# Patient Record
Sex: Female | Born: 1975
Health system: Southern US, Community
[De-identification: ages and names within clinical notes are randomized; demographics above are authoritative.]

## PROBLEM LIST (undated history)

## (undated) DIAGNOSIS — B009 Herpesviral infection, unspecified: Secondary | ICD-10-CM

## (undated) DIAGNOSIS — K219 Gastro-esophageal reflux disease without esophagitis: Secondary | ICD-10-CM

## (undated) DIAGNOSIS — R112 Nausea with vomiting, unspecified: Secondary | ICD-10-CM

## (undated) DIAGNOSIS — R87629 Unspecified abnormal cytological findings in specimens from vagina: Secondary | ICD-10-CM

## (undated) DIAGNOSIS — Z9889 Other specified postprocedural states: Secondary | ICD-10-CM

## (undated) DIAGNOSIS — N879 Dysplasia of cervix uteri, unspecified: Secondary | ICD-10-CM

## (undated) HISTORY — DX: Dysplasia of cervix uteri, unspecified: N87.9

## (undated) HISTORY — PX: COLPOSCOPY: SHX161

## (undated) HISTORY — PX: TONSILLECTOMY AND ADENOIDECTOMY: SUR1326

## (undated) HISTORY — DX: Unspecified abnormal cytological findings in specimens from vagina: R87.629

## (undated) HISTORY — PX: ESOPHAGEAL DILATION: SHX303

## (undated) SURGERY — Surgical Case
Anesthesia: *Unknown

---

## 2000-10-23 ENCOUNTER — Other Ambulatory Visit: Admission: RE | Admit: 2000-10-23 | Discharge: 2000-10-23 | Payer: Self-pay | Admitting: Obstetrics and Gynecology

## 2001-04-04 ENCOUNTER — Other Ambulatory Visit: Admission: RE | Admit: 2001-04-04 | Discharge: 2001-04-04 | Payer: Self-pay | Admitting: *Deleted

## 2002-09-10 ENCOUNTER — Other Ambulatory Visit: Admission: RE | Admit: 2002-09-10 | Discharge: 2002-09-10 | Payer: Self-pay | Admitting: Gynecology

## 2003-10-25 ENCOUNTER — Other Ambulatory Visit: Admission: RE | Admit: 2003-10-25 | Discharge: 2003-10-25 | Payer: Self-pay | Admitting: Gynecology

## 2004-04-06 ENCOUNTER — Other Ambulatory Visit: Admission: RE | Admit: 2004-04-06 | Discharge: 2004-04-06 | Payer: Self-pay | Admitting: Gynecology

## 2005-04-23 ENCOUNTER — Ambulatory Visit: Payer: Self-pay | Admitting: Licensed Clinical Social Worker

## 2005-04-24 ENCOUNTER — Other Ambulatory Visit: Admission: RE | Admit: 2005-04-24 | Discharge: 2005-04-24 | Payer: Self-pay | Admitting: Gynecology

## 2005-04-25 ENCOUNTER — Ambulatory Visit: Payer: Self-pay | Admitting: Licensed Clinical Social Worker

## 2005-05-01 ENCOUNTER — Ambulatory Visit: Payer: Self-pay | Admitting: Licensed Clinical Social Worker

## 2005-05-16 ENCOUNTER — Ambulatory Visit: Payer: Self-pay | Admitting: Licensed Clinical Social Worker

## 2005-05-23 ENCOUNTER — Ambulatory Visit: Payer: Self-pay | Admitting: Licensed Clinical Social Worker

## 2005-06-06 ENCOUNTER — Ambulatory Visit: Payer: Self-pay | Admitting: Licensed Clinical Social Worker

## 2005-06-27 ENCOUNTER — Ambulatory Visit: Payer: Self-pay | Admitting: Licensed Clinical Social Worker

## 2005-10-29 ENCOUNTER — Other Ambulatory Visit: Admission: RE | Admit: 2005-10-29 | Discharge: 2005-10-29 | Payer: Self-pay | Admitting: Gynecology

## 2006-04-29 ENCOUNTER — Other Ambulatory Visit: Admission: RE | Admit: 2006-04-29 | Discharge: 2006-04-29 | Payer: Self-pay | Admitting: Gynecology

## 2007-06-30 ENCOUNTER — Other Ambulatory Visit: Admission: RE | Admit: 2007-06-30 | Discharge: 2007-06-30 | Payer: Self-pay | Admitting: Gynecology

## 2008-06-07 ENCOUNTER — Encounter: Admission: RE | Admit: 2008-06-07 | Discharge: 2008-06-07 | Payer: Self-pay | Admitting: Gastroenterology

## 2008-10-08 ENCOUNTER — Other Ambulatory Visit: Admission: RE | Admit: 2008-10-08 | Discharge: 2008-10-08 | Payer: Self-pay | Admitting: Gynecology

## 2008-10-08 ENCOUNTER — Encounter: Payer: Self-pay | Admitting: Gynecology

## 2008-10-08 ENCOUNTER — Ambulatory Visit: Payer: Self-pay | Admitting: Gynecology

## 2009-10-17 ENCOUNTER — Ambulatory Visit: Payer: Self-pay | Admitting: Gynecology

## 2009-10-17 ENCOUNTER — Other Ambulatory Visit: Admission: RE | Admit: 2009-10-17 | Discharge: 2009-10-17 | Payer: Self-pay | Admitting: Gynecology

## 2009-11-24 ENCOUNTER — Ambulatory Visit: Payer: Self-pay | Admitting: Gynecology

## 2010-01-26 ENCOUNTER — Ambulatory Visit
Admission: RE | Admit: 2010-01-26 | Discharge: 2010-01-26 | Payer: Self-pay | Source: Home / Self Care | Attending: Women's Health | Admitting: Women's Health

## 2010-04-13 IMAGING — RF DG ESOPHAGUS
12 of 14 series · 19 of 24 positions shown · non-contrast
Comparison: None

CLINICAL DATA: Dysphagia.

ESOPHOGRAM / BARIUM SWALLOW  / BARIUM TABLET STUDY
TECHNIQUE: Combined double contrast and single contrast
examination performed using effervescent crystals, thick barium
liquid, and thin barium liquid.  The patient was observed with
fluoroscopy swallowing a 13mm barium sulphate tablet.
Fluoroscopy time:  1.6 minutes.

[Series 1: run · 1 of 2 slices shown (1 of 12)]
[im 1/2]
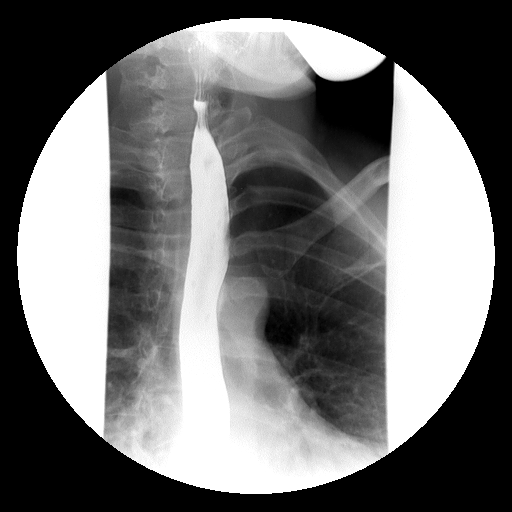

[Series 2: run · 1 of 2 slices shown (2 of 12)]
[im 1/2]
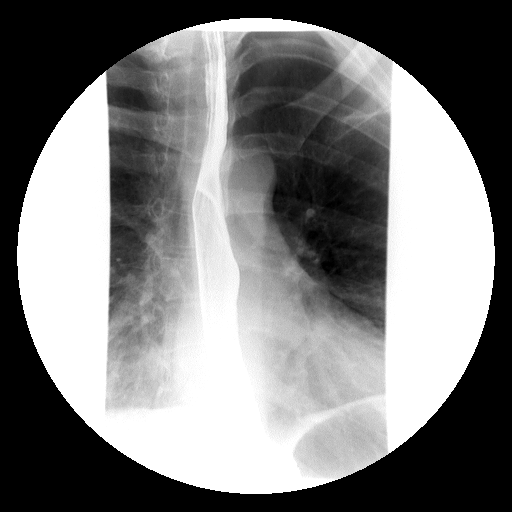

[Series 4: run · 1 of 2 slices shown (3 of 12)]
[im 1/2]
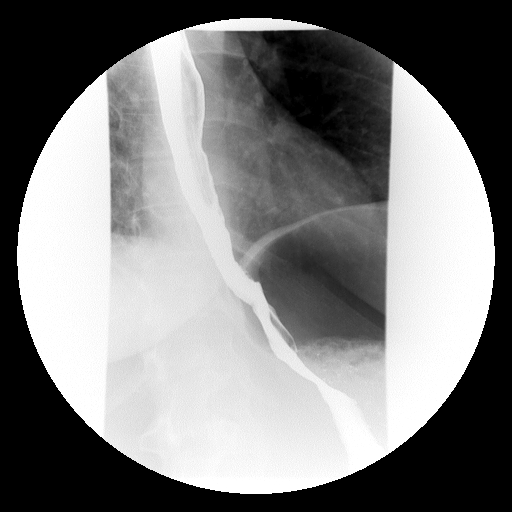

[Series 5: run · 1 of 1 slices shown (4 of 12)]
[im 1/1]
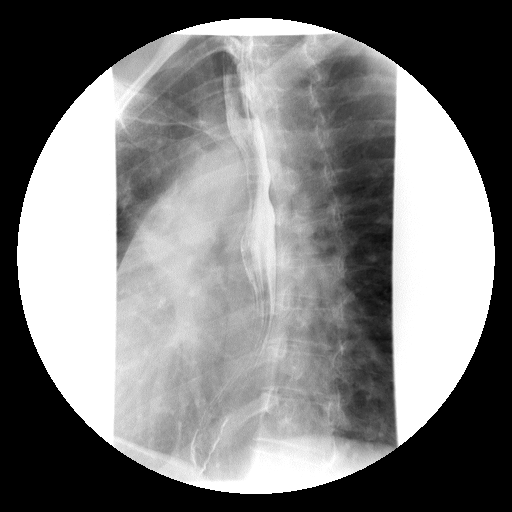

[Series 6: run · 1 of 1 slices shown (5 of 12)]
[im 1/1]
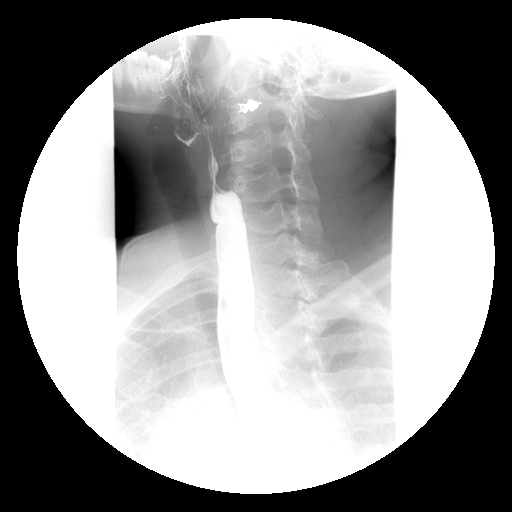

[Series 7: run · 1 of 1 slices shown (6 of 12)]
[im 1/1]
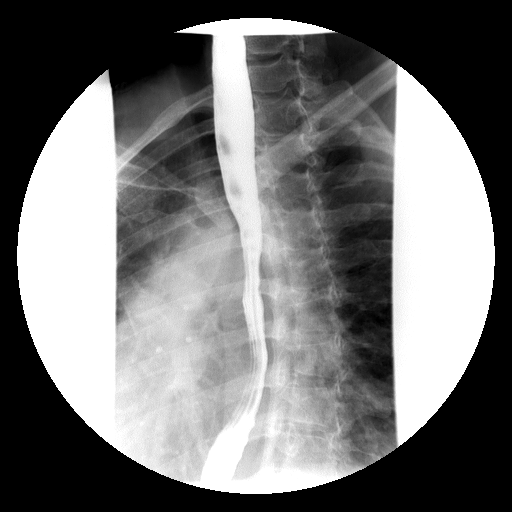

[Series 9: run · 1 of 1 slices shown (7 of 12)]
[im 1/1]
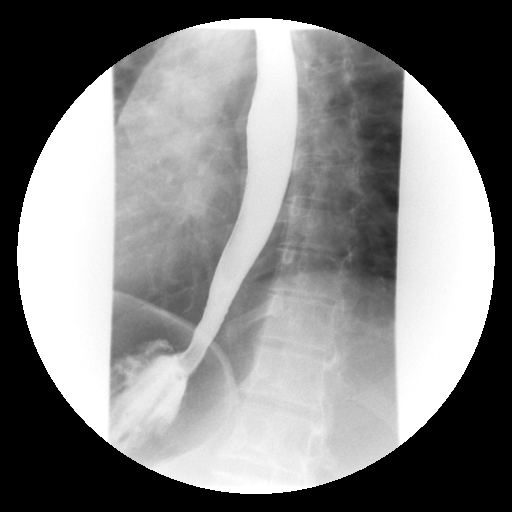

[Series 10: run · 1 of 1 slices shown (8 of 12)]
[im 1/1]
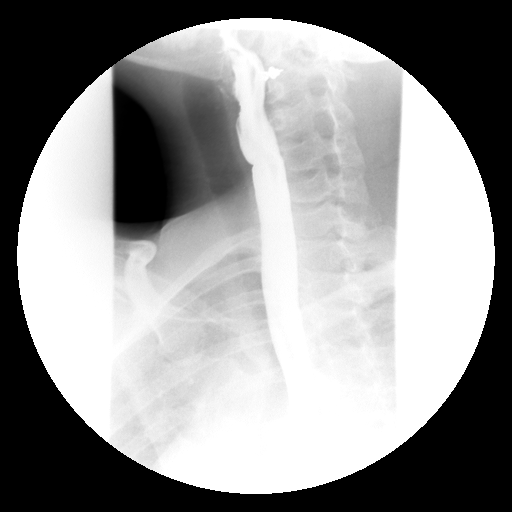

[Series 11: run · 1 of 1 slices shown (9 of 12)]
[im 1/1]
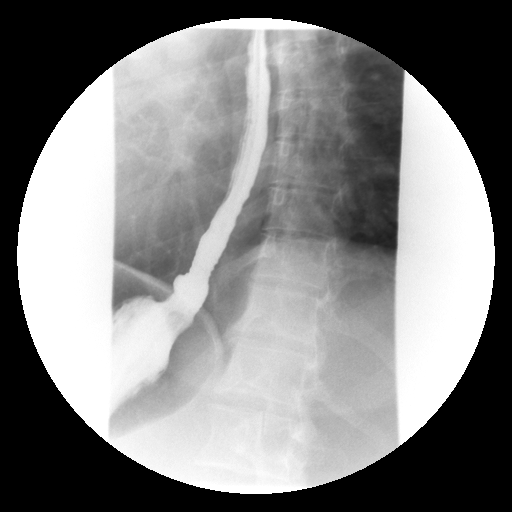

[Series 12: run · 5 of 9 slices shown (10 of 12)]
[im 2/9]
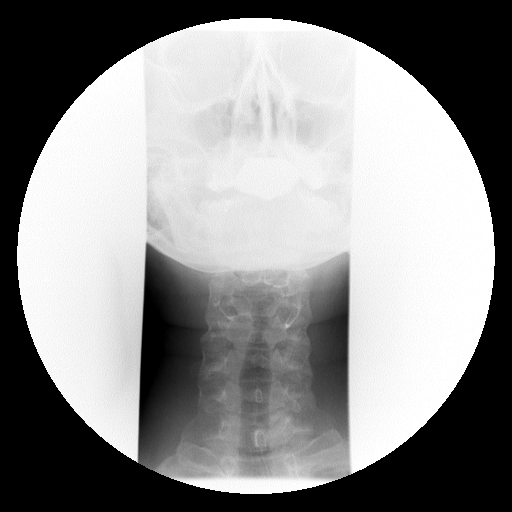
[im 3/9]
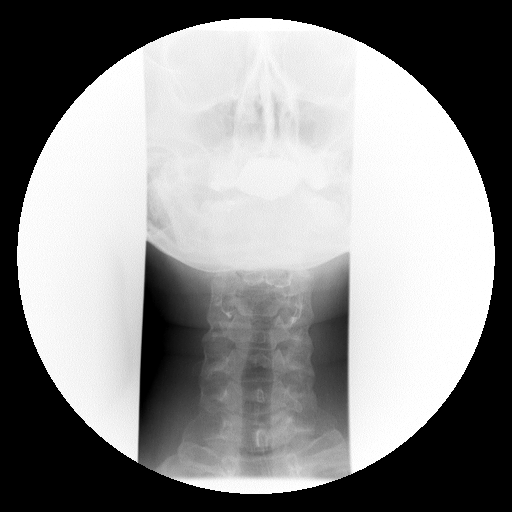
[im 5/9]
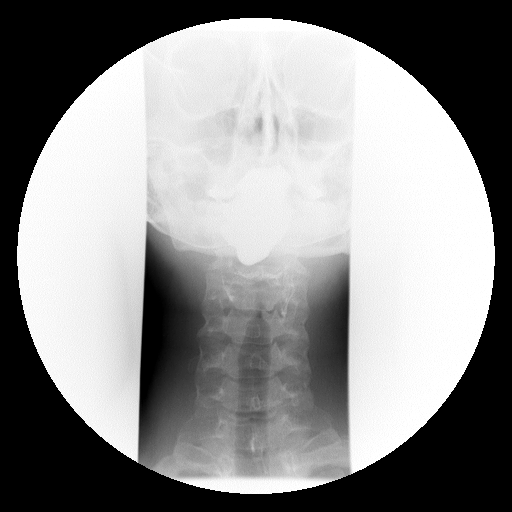
[im 6/9]
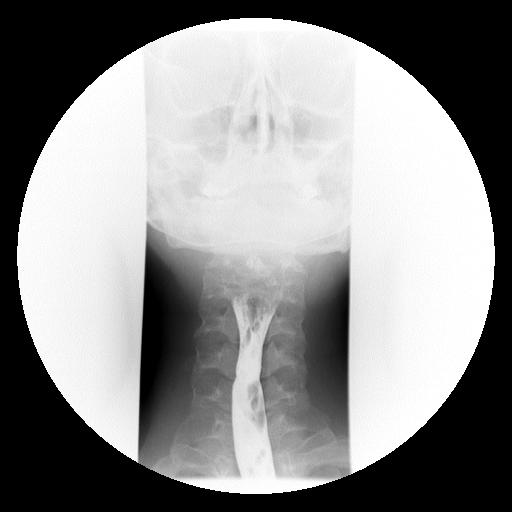
[im 9/9]
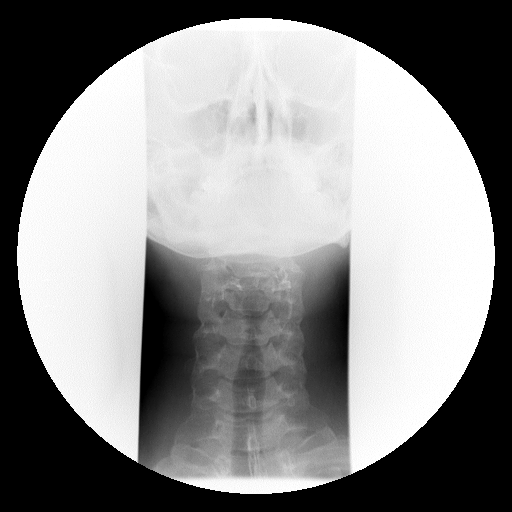

[Series 13: run · 4 of 7 slices shown (11 of 12)]
[im 1/7]
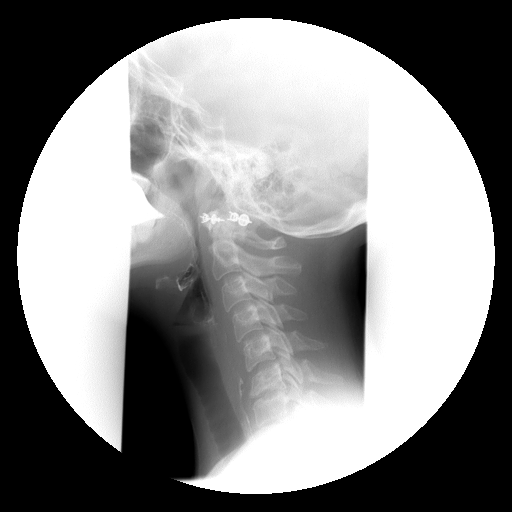
[im 2/7]
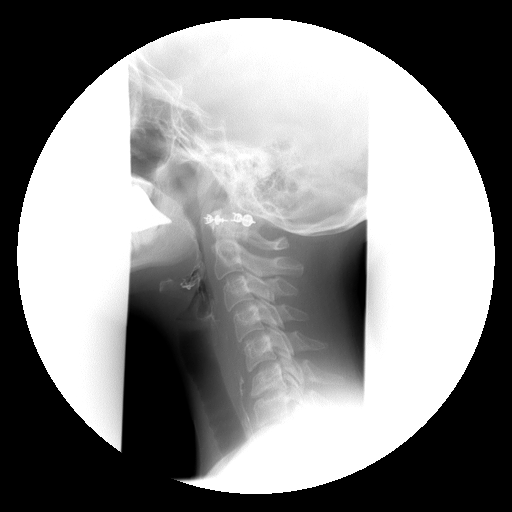
[im 4/7]
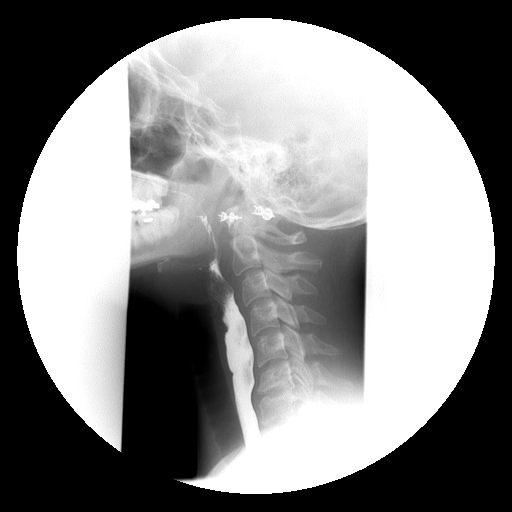
[im 7/7]
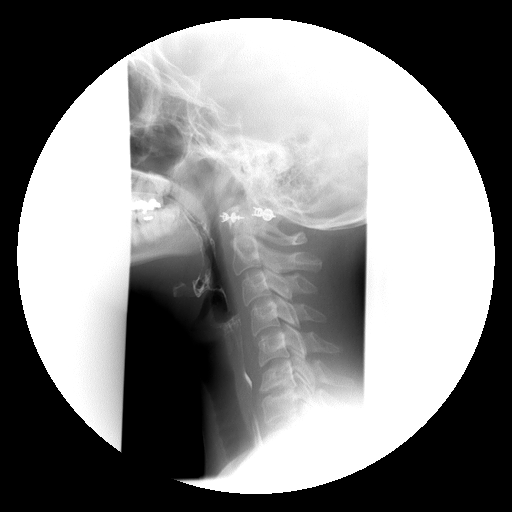

[Series 14: run · 1 of 1 slices shown (12 of 12)]
[im 1/1]
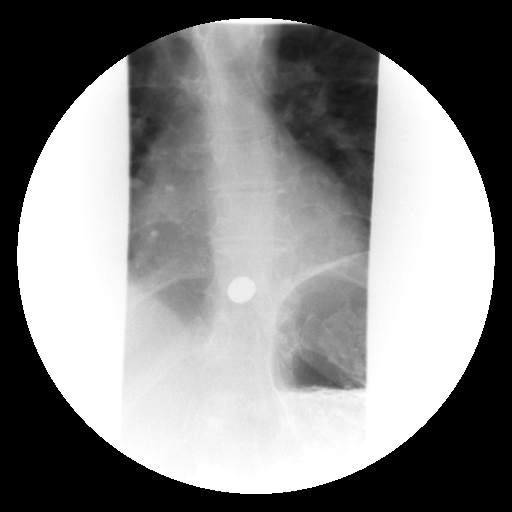

[19 of 24 positions shown; findings below may reference images not displayed]

FINDINGS: Esophageal peristalsis is normal.  Note is made of an
asymptomatic esophageal web at the pharyngoesophageal junction.
Esophageal mucosal pattern is normal.  There is smooth narrowing of
the distal esophagus near the gastroesophageal junction.  No hiatal
hernia is identified.  Evaluation of the cervical esophagus is
unremarkable.  Barium tablet is swallowed without difficulty but is
upheld at the gastroesophageal junction.
IMPRESSION: 1.  Normal esophageal peristalsis.
2.  Asymptomatic esophageal web.
3.  Smooth distal esophageal stricture, upholding the 13 mm barium
tablet.

## 2010-08-07 ENCOUNTER — Encounter: Payer: Self-pay | Admitting: *Deleted

## 2010-10-20 ENCOUNTER — Encounter: Payer: Self-pay | Admitting: Women's Health

## 2010-10-20 ENCOUNTER — Other Ambulatory Visit (HOSPITAL_COMMUNITY)
Admission: RE | Admit: 2010-10-20 | Discharge: 2010-10-20 | Disposition: A | Discharge status: Home / Self Care | Payer: BC Managed Care – PPO | Source: Ambulatory Visit | Attending: Gynecology | Admitting: Gynecology

## 2010-10-20 ENCOUNTER — Ambulatory Visit (INDEPENDENT_AMBULATORY_CARE_PROVIDER_SITE_OTHER): Payer: BC Managed Care – PPO | Admitting: Women's Health

## 2010-10-20 VITALS — BP 114/70 | Ht 63.0 in | Wt 136.0 lb

## 2010-10-20 DIAGNOSIS — IMO0001 Reserved for inherently not codable concepts without codable children: Secondary | ICD-10-CM

## 2010-10-20 DIAGNOSIS — Z13220 Encounter for screening for lipoid disorders: Secondary | ICD-10-CM

## 2010-10-20 DIAGNOSIS — Z113 Encounter for screening for infections with a predominantly sexual mode of transmission: Secondary | ICD-10-CM

## 2010-10-20 DIAGNOSIS — Z01419 Encounter for gynecological examination (general) (routine) without abnormal findings: Secondary | ICD-10-CM | POA: Insufficient documentation

## 2010-10-20 DIAGNOSIS — Z833 Family history of diabetes mellitus: Secondary | ICD-10-CM

## 2010-10-20 DIAGNOSIS — Z309 Encounter for contraceptive management, unspecified: Secondary | ICD-10-CM

## 2010-10-20 LAB — RPR

## 2010-10-20 MED ORDER — LEVONORGEST-ETH ESTRAD 91-DAY 0.15-0.03 MG PO TABS: 1.0000 | ORAL_TABLET | Freq: Every day | ORAL | Status: DC

## 2010-10-20 NOTE — Progress Notes (Signed)
Faith Jacobson 19-Mar-1975 [id number]    History:    The patient presents for annual exam.  Works at [employer].   Past medical history, past surgical history, family history and social history were all reviewed and documented in the EPIC chart.   ROS:  A  ROS was performed and pertinent positives and negatives are included in the history.  Exam:  Filed Vitals:   10/20/10 1550  BP: 114/70    General appearance:  Normal Head/Neck:  Normal, without cervical or supraclavicular adenopathy. Thyroid:  Symmetrical, normal in size, without palpable masses or nodularity. Respiratory  Effort:  Normal  Auscultation:  Clear without wheezing or rhonchi Cardiovascular  Auscultation:  Regular rate, without rubs, murmurs or gallops  Edema/varicosities:  Not grossly evident Abdominal  Soft,nontender, without masses, guarding or rebound.  Liver/spleen:  No organomegaly noted  Hernia:  None appreciated  Skin  Inspection:  Grossly normal  Palpation:  Grossly normal Neurologic/psychiatric  Orientation:  Normal with appropriate conversation.  Mood/affect:  Normal  Genitourinary    Breasts: Examined lying and sitting.     Right: Without masses, retractions, discharge or axillary adenopathy.     Left: Without masses, retractions, discharge or axillary adenopathy.   Inguinal/mons:  Normal without inguinal adenopathy  External genitalia:  Normal  BUS/Urethra/Skene's glands:  Normal  Bladder:  Normal  Vagina:  Normal  Cervix:  Normal  Uterus:   normal in size, shape and contour.  Midline and mobile  Adnexa/parametria:     Rt: Without masses or tenderness.   Lt: Without masses or tenderness.  Anus and perineum: Normal  Digital rectal exam: Normal sphincter tone without palpated masses or tenderness  Assessment/Plan:  35 y.o. SWF G0 for annual exam. Contraceptives with Seasonale with a cycle every 3 months for 2-3 days. She is in the process of quitting smoking, aware when she  is 35  increased risk for blood clots strokes with smoking and birth control pills. She states she is ready to quit declines need for medication, she states she will get a reduced rate with her insurance which is also motivating her to quit smoking.    Normal GYN exam/STD screen/history LGSIL October, 2011  Plan: Seasonale  prescription, proper use, risks of blood clots, DVTs. Did review importance of no birth control pills after age 35 if smoking due to risks. Verbalizes understanding. SBEs, exercise, calcium rich diet, MVI daily encouraged. CBC, glucose, cholesterol, UA and Pap GC/ Chlamydia, HIV, hep B and C., and and RPR. Encourage condoms when sexually active. History of a C&B October of 2011 LG SIL, repeat Pap in 6 months if normal.   Harrington Challenger [facility], 4:42 PM 10/20/2010

## 2010-10-20 NOTE — Progress Notes (Signed)
Addended by: Landis Martins R on: 10/20/2010 05:08 PM   Modules accepted: Orders

## 2010-10-21 LAB — HEPATITIS C ANTIBODY: HCV Ab: NEGATIVE

## 2010-10-21 LAB — HEPATITIS B SURFACE ANTIGEN: Hepatitis B Surface Ag: NEGATIVE

## 2010-11-02 ENCOUNTER — Other Ambulatory Visit: Payer: Self-pay | Admitting: Gynecology

## 2011-04-19 ENCOUNTER — Inpatient Hospital Stay (HOSPITAL_COMMUNITY): Admission: RE | Admit: 2011-04-19 | Payer: Self-pay | Source: Ambulatory Visit

## 2011-04-19 ENCOUNTER — Ambulatory Visit (INDEPENDENT_AMBULATORY_CARE_PROVIDER_SITE_OTHER): Payer: BC Managed Care – PPO | Admitting: Women's Health

## 2011-04-19 ENCOUNTER — Encounter: Payer: Self-pay | Admitting: Women's Health

## 2011-04-19 DIAGNOSIS — B373 Candidiasis of vulva and vagina: Secondary | ICD-10-CM

## 2011-04-19 DIAGNOSIS — B3731 Acute candidiasis of vulva and vagina: Secondary | ICD-10-CM

## 2011-04-19 DIAGNOSIS — IMO0002 Reserved for concepts with insufficient information to code with codable children: Secondary | ICD-10-CM

## 2011-04-19 DIAGNOSIS — R87619 Unspecified abnormal cytological findings in specimens from cervix uteri: Secondary | ICD-10-CM

## 2011-04-19 LAB — WET PREP FOR TRICH, YEAST, CLUE: Trich, Wet Prep: NONE SEEN

## 2011-04-19 MED ORDER — FLUCONAZOLE 150 MG PO TABS: 150.0000 mg | ORAL_TABLET | Freq: Once | ORAL | Status: AC

## 2011-04-19 NOTE — Progress Notes (Signed)
Patient ID: Faith Jacobson, female   DOB: Apr 06, 1975, 36 y.o.   MRN: [id number] Presents for repeat Pap. History of LGSIL/CIN 1 October of 2011 on colposcopy with biopsy. Normal Paps after. Also states is having some vaginal discharge with itching. Same partner.  Exam: External genitalia within normal limits, speculum exam scant white discharge with no odor, vaginal wall slightly erythematous. Wet prep negative, Pap taken. Bimanual no CMT or adnexal fullness or tenderness.  Plan: Diflucan 150 by mouth for symptoms of yeast.  Pap results if normal will resume annual screening.

## 2011-10-11 ENCOUNTER — Other Ambulatory Visit: Payer: Self-pay | Admitting: Gynecology

## 2011-10-22 ENCOUNTER — Ambulatory Visit (INDEPENDENT_AMBULATORY_CARE_PROVIDER_SITE_OTHER): Payer: BC Managed Care – PPO | Admitting: Women's Health

## 2011-10-22 ENCOUNTER — Encounter: Payer: Self-pay | Admitting: Women's Health

## 2011-10-22 VITALS — BP 102/60 | Ht 63.5 in | Wt 124.0 lb

## 2011-10-22 DIAGNOSIS — Z01419 Encounter for gynecological examination (general) (routine) without abnormal findings: Secondary | ICD-10-CM

## 2011-10-22 DIAGNOSIS — Z833 Family history of diabetes mellitus: Secondary | ICD-10-CM

## 2011-10-22 DIAGNOSIS — Z113 Encounter for screening for infections with a predominantly sexual mode of transmission: Secondary | ICD-10-CM

## 2011-10-22 DIAGNOSIS — F17200 Nicotine dependence, unspecified, uncomplicated: Secondary | ICD-10-CM

## 2011-10-22 DIAGNOSIS — N879 Dysplasia of cervix uteri, unspecified: Secondary | ICD-10-CM

## 2011-10-22 DIAGNOSIS — Z13220 Encounter for screening for lipoid disorders: Secondary | ICD-10-CM

## 2011-10-22 DIAGNOSIS — F17210 Nicotine dependence, cigarettes, uncomplicated: Secondary | ICD-10-CM | POA: Insufficient documentation

## 2011-10-22 LAB — CBC WITH DIFFERENTIAL/PLATELET
Eosinophils Absolute: 0.4 10*3/uL (ref 0.0–0.7)
Eosinophils Relative: 4 % (ref 0–5)
Hemoglobin: 14.4 g/dL (ref 12.0–15.0)
Lymphocytes Relative: 34 % (ref 12–46)
Lymphs Abs: 3.5 10*3/uL (ref 0.7–4.0)
MCH: 32.2 pg (ref 26.0–34.0)
MCV: 94.2 fL (ref 78.0–100.0)
Monocytes Relative: 7 % (ref 3–12)
Platelets: 283 10*3/uL (ref 150–400)
RBC: 4.47 MIL/uL (ref 3.87–5.11)
WBC: 10.3 10*3/uL (ref 4.0–10.5)

## 2011-10-22 NOTE — Patient Instructions (Signed)
Health Maintenance, Females A healthy lifestyle and preventative care can promote health and wellness.  Maintain regular health, dental, and eye exams.   Eat a healthy diet. Foods like vegetables, fruits, whole grains, low-fat dairy products, and lean protein foods contain the nutrients you need without too many calories. Decrease your intake of foods high in solid fats, added sugars, and salt. Get information about a proper diet from your caregiver, if necessary.   Regular physical exercise is one of the most important things you can do for your health. Most adults should get at least 150 minutes of moderate-intensity exercise (any activity that increases your heart rate and causes you to sweat) each week. In addition, most adults need muscle-strengthening exercises on 2 or more days a week.    Maintain a healthy weight. The body mass index (BMI) is a screening tool to identify possible weight problems. It provides an estimate of body fat based on height and weight. Your caregiver can help determine your BMI, and can help you achieve or maintain a healthy weight. For adults 20 years and older:   A BMI below 18.5 is considered underweight.   A BMI of 18.5 to 24.9 is normal.   A BMI of 25 to 29.9 is considered overweight.   A BMI of 30 and above is considered obese.   Maintain normal blood lipids and cholesterol by exercising and minimizing your intake of saturated fat. Eat a balanced diet with plenty of fruits and vegetables. Blood tests for lipids and cholesterol should begin at age 20 and be repeated every 5 years. If your lipid or cholesterol levels are high, you are over 50, or you are a high risk for heart disease, you may need your cholesterol levels checked more frequently.Ongoing high lipid and cholesterol levels should be treated with medicines if diet and exercise are not effective.   If you smoke, find out from your caregiver how to quit. If you do not use tobacco, do not start.    If you are pregnant, do not drink alcohol. If you are breastfeeding, be very cautious about drinking alcohol. If you are not pregnant and choose to drink alcohol, do not exceed 1 drink per day. One drink is considered to be 12 ounces (355 mL) of beer, 5 ounces (148 mL) of wine, or 1.5 ounces (44 mL) of liquor.   Avoid use of street drugs. Do not share needles with anyone. Ask for help if you need support or instructions about stopping the use of drugs.   High blood pressure causes heart disease and increases the risk of stroke. Blood pressure should be checked at least every 1 to 2 years. Ongoing high blood pressure should be treated with medicines, if weight loss and exercise are not effective.   If you are 55 to 36 years old, ask your caregiver if you should take aspirin to prevent strokes.   Diabetes screening involves taking a blood sample to check your fasting blood sugar level. This should be done once every 3 years, after age 45, if you are within normal weight and without risk factors for diabetes. Testing should be considered at a younger age or be carried out more frequently if you are overweight and have at least 1 risk factor for diabetes.   Breast cancer screening is essential preventative care for women. You should practice "breast self-awareness." This means understanding the normal appearance and feel of your breasts and may include breast self-examination. Any changes detected, no matter how   small, should be reported to a caregiver. Women in their 20s and 30s should have a clinical breast exam (CBE) by a caregiver as part of a regular health exam every 1 to 3 years. After age 40, women should have a CBE every year. Starting at age 40, women should consider having a mammogram (breast X-ray) every year. Women who have a family history of breast cancer should talk to their caregiver about genetic screening. Women at a high risk of breast cancer should talk to their caregiver about having  an MRI and a mammogram every year.   The Pap test is a screening test for cervical cancer. Women should have a Pap test starting at age 21. Between ages 21 and 29, Pap tests should be repeated every 2 years. Beginning at age 30, you should have a Pap test every 3 years as long as the past 3 Pap tests have been normal. If you had a hysterectomy for a problem that was not cancer or a condition that could lead to cancer, then you no longer need Pap tests. If you are between ages 65 and 70, and you have had normal Pap tests going back 10 years, you no longer need Pap tests. If you have had past treatment for cervical cancer or a condition that could lead to cancer, you need Pap tests and screening for cancer for at least 20 years after your treatment. If Pap tests have been discontinued, risk factors (such as a new sexual partner) need to be reassessed to determine if screening should be resumed. Some women have medical problems that increase the chance of getting cervical cancer. In these cases, your caregiver may recommend more frequent screening and Pap tests.   The human papillomavirus (HPV) test is an additional test that may be used for cervical cancer screening. The HPV test looks for the virus that can cause the cell changes on the cervix. The cells collected during the Pap test can be tested for HPV. The HPV test could be used to screen women aged 30 years and older, and should be used in women of any age who have unclear Pap test results. After the age of 30, women should have HPV testing at the same frequency as a Pap test.   Colorectal cancer can be detected and often prevented. Most routine colorectal cancer screening begins at the age of 50 and continues through age 75. However, your caregiver may recommend screening at an earlier age if you have risk factors for colon cancer. On a yearly basis, your caregiver may provide home test kits to check for hidden blood in the stool. Use of a small camera at  the end of a tube, to directly examine the colon (sigmoidoscopy or colonoscopy), can detect the earliest forms of colorectal cancer. Talk to your caregiver about this at age 50, when routine screening begins. Direct examination of the colon should be repeated every 5 to 10 years through age 75, unless early forms of pre-cancerous polyps or small growths are found.   Hepatitis C blood testing is recommended for all people born from 1945 through 1965 and any individual with known risks for hepatitis C.   Practice safe sex. Use condoms and avoid high-risk sexual practices to reduce the spread of sexually transmitted infections (STIs). Sexually active women aged 25 and younger should be checked for Chlamydia, which is a common sexually transmitted infection. Older women with new or multiple partners should also be tested for Chlamydia. Testing for other   STIs is recommended if you are sexually active and at increased risk.   Osteoporosis is a disease in which the bones lose minerals and strength with aging. This can result in serious bone fractures. The risk of osteoporosis can be identified using a bone density scan. Women ages 74 and over and women at risk for fractures or osteoporosis should discuss screening with their caregivers. Ask your caregiver whether you should be taking a calcium supplement or vitamin D to reduce the rate of osteoporosis.   Menopause can be associated with physical symptoms and risks. Hormone replacement therapy is available to decrease symptoms and risks. You should talk to your caregiver about whether hormone replacement therapy is right for you.   Use sunscreen with a sun protection factor (SPF) of 30 or greater. Apply sunscreen liberally and repeatedly throughout the day. You should seek shade when your shadow is shorter than you. Protect yourself by wearing long sleeves, pants, a wide-brimmed hat, and sunglasses year round, whenever you are outdoors.   Notify your caregiver  of new moles or changes in moles, especially if there is a change in shape or color. Also notify your caregiver if a mole is larger than the size of a pencil eraser.   Stay current with your immunizations.  Document Released: 07/31/2010 Document Revised: 01/04/2011 Document Reviewed: 07/31/2010 [facility] Patient Information 2012 [place], [place].Levonorgestrel intrauterine device (IUD) What is this medicine? LEVONORGESTREL IUD (LEE voe nor jes trel) is a contraceptive (birth control) device. It is used to prevent pregnancy and to treat heavy bleeding that occurs during your period. It can be used for up to 5 years. This medicine may be used for other purposes; ask your health care provider or pharmacist if you have questions. What should I tell my health care provider before I take this medicine? They need to know if you have any of these conditions: -abnormal Pap smear -cancer of the breast, uterus, or cervix -diabetes -endometritis -genital or pelvic infection now or in the past -have more than one sexual partner or your partner has more than one partner -heart disease -history of an ectopic or tubal pregnancy -immune system problems -IUD in place -liver disease or tumor -problems with blood clots or take blood-thinners -use intravenous drugs -uterus of unusual shape -vaginal bleeding that has not been explained -an unusual or allergic reaction to levonorgestrel, other hormones, silicone, or polyethylene, medicines, foods, dyes, or preservatives -pregnant or trying to get pregnant -breast-feeding How should I use this medicine? This device is placed inside the uterus by a health care professional. Talk to your pediatrician regarding the use of this medicine in children. Special care may be needed. Overdosage: If you think you have taken too much of this medicine contact a poison control center or emergency room at once. NOTE: This medicine is only for you. Do not share this medicine  with others. What if I miss a dose? This does not apply. What may interact with this medicine? Do not take this medicine with any of the following medications: -amprenavir -bosentan -fosamprenavir This medicine may also interact with the following medications: -aprepitant -barbiturate medicines for inducing sleep or treating seizures -bexarotene -griseofulvin -medicines to treat seizures like carbamazepine, ethotoin, felbamate, oxcarbazepine, phenytoin, topiramate -modafinil -pioglitazone -rifabutin -rifampin -rifapentine -some medicines to treat HIV infection like atazanavir, indinavir, lopinavir, nelfinavir, tipranavir, ritonavir -St. John's wort -warfarin This list may not describe all possible interactions. Give your health care provider a list of all the medicines, herbs, non-prescription drugs, or  dietary supplements you use. Also tell them if you smoke, drink alcohol, or use illegal drugs. Some items may interact with your medicine. What should I watch for while using this medicine? Visit your doctor or health care professional for regular check ups. See your doctor if you or your partner has sexual contact with others, becomes HIV positive, or gets a sexual transmitted disease. This product does not protect you against HIV infection (AIDS) or other sexually transmitted diseases. You can check the placement of the IUD yourself by reaching up to the top of your vagina with clean fingers to feel the threads. Do not pull on the threads. It is a good habit to check placement after each menstrual period. Call your doctor right away if you feel more of the IUD than just the threads or if you cannot feel the threads at all. The IUD may come out by itself. You may become pregnant if the device comes out. If you notice that the IUD has come out use a backup birth control method like condoms and call your health care provider. Using tampons will not change the position of the IUD and are  okay to use during your period. What side effects may I notice from receiving this medicine? Side effects that you should report to your doctor or health care professional as soon as possible: -allergic reactions like skin rash, itching or hives, swelling of the face, lips, or tongue -fever, flu-like symptoms -genital sores -high blood pressure -no menstrual period for 6 weeks during use -pain, swelling, warmth in the leg -pelvic pain or tenderness -severe or sudden headache -signs of pregnancy -stomach cramping -sudden shortness of breath -trouble with balance, talking, or walking -unusual vaginal bleeding, discharge -yellowing of the eyes or skin Side effects that usually do not require medical attention (report to your doctor or health care professional if they continue or are bothersome): -acne -breast pain -change in sex drive or performance -changes in weight -cramping, dizziness, or faintness while the device is being inserted -headache -irregular menstrual bleeding within first 3 to 6 months of use -nausea This list may not describe all possible side effects. Call your doctor for medical advice about side effects. You may report side effects to FDA at 1-800-FDA-1088. Where should I keep my medicine? This does not apply. NOTE: This sheet is a summary. It may not cover all possible information. If you have questions about this medicine, talk to your doctor, pharmacist, or health care provider.  2012, [employer]vier/Gold Standard. (02/06/2008 6:39:08 PM)

## 2011-10-22 NOTE — Progress Notes (Signed)
Breanna Shorkey 1975-03-12 [id number]    History:    The patient presents for annual exam.  Cycles every 3 months on Seasonale without complaint. History of LGSIL 2006, 2011 with C&B 10/11 LGSIL CIN-1. Pap ascus 03/2011, Pap September 2012 benign reactive changes. New partner. Smoker half pack per day.   Past medical history, past surgical history, family history and social history were all reviewed and documented in the EPIC chart. Works at [employer]. Maternal grandfather diabetic. Maternal aunt with breast cancer. Lost 15 pounds with diet.   ROS:  A  ROS was performed and pertinent positives and negatives are included in the history.  Exam:  Filed Vitals:   10/22/11 1610  BP: 102/60    General appearance:  Normal Head/Neck:  Normal, without cervical or supraclavicular adenopathy. Thyroid:  Symmetrical, normal in size, without palpable masses or nodularity. Respiratory  Effort:  Normal  Auscultation:  Clear without wheezing or rhonchi Cardiovascular  Auscultation:  Regular rate, without rubs, murmurs or gallops  Edema/varicosities:  Not grossly evident Abdominal  Soft,nontender, without masses, guarding or rebound.  Liver/spleen:  No organomegaly noted  Hernia:  None appreciated  Skin  Inspection:  Grossly normal  Palpation:  Grossly normal Neurologic/psychiatric  Orientation:  Normal with appropriate conversation.  Mood/affect:  Normal  Genitourinary    Breasts: Examined lying and sitting.     Right: Without masses, retractions, discharge or axillary adenopathy.     Left: Without masses, retractions, discharge or axillary adenopathy.   Inguinal/mons:  Normal without inguinal adenopathy  External genitalia:  Normal  BUS/Urethra/Skene's glands:  Normal  Bladder:  Normal  Vagina:  Normal  Cervix:  Normal  Uterus:   normal in size, shape and contour.  Midline and mobile  Adnexa/parametria:     Rt: Without masses or tenderness.   Lt: Without masses or  tenderness.  Anus and perineum: Normal  Digital rectal exam: Normal sphincter tone without palpated masses or tenderness  Assessment/Plan:  36 y.o. S. WF G0 for annual exam with no complaints.  History Cervical dysplasia LGSIL CIN-1 2011 Smoker half pack daily  Plan: Contraception reviewed, will stop pills after current pack, reviewed no combination birth control pills with smoking, options reviewed. Discussed Mirena IUD, nexplanon, progestin only pills. Currently not sexually active. Will call if chooses Mirena IUD, reviewed pros/cons risks of infection, perforation hemorrhage. Aware Dr. Audie Box will place with cycle. Condoms encouraged until permanent  partner. SBE's, continue exercise and healthy diet, MVI daily encouraged. CBC, glucose, lipid panel, UA and Pap. GC/Chlamydia, declines HIV, hepatitis or RPR. Aware of need to quit smoking declines Chantix.Marland Kitchen     Harrington Challenger [facility], 5:04 PM 10/22/2011

## 2011-10-23 ENCOUNTER — Other Ambulatory Visit (HOSPITAL_COMMUNITY)
Admission: RE | Admit: 2011-10-23 | Discharge: 2011-10-23 | Disposition: A | Discharge status: Home / Self Care | Payer: BC Managed Care – PPO | Source: Ambulatory Visit | Attending: Women's Health | Admitting: Women's Health

## 2011-10-23 DIAGNOSIS — Z1151 Encounter for screening for human papillomavirus (HPV): Secondary | ICD-10-CM | POA: Insufficient documentation

## 2011-10-23 DIAGNOSIS — Z01419 Encounter for gynecological examination (general) (routine) without abnormal findings: Secondary | ICD-10-CM | POA: Insufficient documentation

## 2011-10-23 DIAGNOSIS — R87810 Cervical high risk human papillomavirus (HPV) DNA test positive: Secondary | ICD-10-CM | POA: Insufficient documentation

## 2011-10-23 LAB — LIPID PANEL
HDL: 54 mg/dL (ref >39)
LDL Cholesterol: 106 mg/dL — ABNORMAL HIGH (ref 0–99)
Total CHOL/HDL Ratio: 3.4 Ratio
Triglycerides: 128 mg/dL (ref <150)

## 2011-10-23 LAB — URINALYSIS W MICROSCOPIC + REFLEX CULTURE
Bacteria, UA: NONE SEEN
Casts: NONE SEEN
Glucose, UA: NEGATIVE mg/dL
Hgb urine dipstick: NEGATIVE
Ketones, ur: NEGATIVE mg/dL
Protein, ur: NEGATIVE mg/dL
pH: 7 (ref 5.0–8.0)

## 2011-10-23 LAB — GC/CHLAMYDIA PROBE AMP, GENITAL
Chlamydia, DNA Probe: NEGATIVE
GC Probe Amp, Genital: NEGATIVE

## 2011-10-23 LAB — GLUCOSE, RANDOM: Glucose, Bld: 69 mg/dL — ABNORMAL LOW (ref 70–99)

## 2011-10-23 NOTE — Addendum Note (Signed)
Addended by: Richardson Chiquito on: 10/23/2011 08:11 AM   Modules accepted: Orders

## 2012-02-08 ENCOUNTER — Telehealth: Payer: Self-pay | Admitting: *Deleted

## 2012-02-08 ENCOUNTER — Telehealth: Payer: Self-pay | Admitting: Women's Health

## 2012-02-08 ENCOUNTER — Other Ambulatory Visit: Payer: Self-pay | Admitting: Women's Health

## 2012-02-08 DIAGNOSIS — IMO0001 Reserved for inherently not codable concepts without codable children: Secondary | ICD-10-CM

## 2012-02-08 MED ORDER — NORETHINDRONE 0.35 MG PO TABS: 1.0000 | ORAL_TABLET | Freq: Every day | ORAL | Status: DC

## 2012-02-08 NOTE — Telephone Encounter (Signed)
Telephone call, states has met the man of her dreams and is having difficulty using condoms consistently, has used Plan B emergency contraception, had been on Seasonale prior, is currently on her cycle would like to have nexplanon placed. Trying to get approval to have placed today or Monday. Will call back after approval.

## 2012-02-08 NOTE — Telephone Encounter (Signed)
Pt called today requesting if she could get a sample pack or 1 month worth of birth control pills. She is still planning to get the mirena IUD, but wendy is having trouble getting in contact with pt insurance company. She has already taken 2 plan B pills, she has cut back on smoking to about 3-5 cigarettes a day. Please advise

## 2012-02-08 NOTE — Telephone Encounter (Signed)
LM on pt voicemail that her [facility] medical insurance doesn't cover the Nexplanon or insertion. They said it may be covered by her pharmacy benefits. She would be responsible for the insertion cost of $329.00 as well. She will get back to me for Nexplanon code so she can check with her pharmacy carrier to see if they can provide it/WL

## 2012-02-27 ENCOUNTER — Encounter: Payer: Self-pay | Admitting: Women's Health

## 2012-02-27 ENCOUNTER — Telehealth: Payer: Self-pay | Admitting: *Deleted

## 2012-02-27 NOTE — Telephone Encounter (Signed)
Has been smoke-free almost 1 month. Reviewed dangers of smoking with combination pills. Is not having problems with progestin only pill will continue on Micronor. Instructed to call if problems. Congratulated on quitting smoking.

## 2012-02-27 NOTE — Telephone Encounter (Signed)
Pt called to back and has decided that she would like to stay on birth control pills. She has met the man of her dreams and they may plan to have a child in the future. She has quick smoking and doesn't plan on starting back, she asked if you were okay with this? The Nexplanon is not something this would be her choice right now with her future plan. She asked me to run this information by you. Please advise

## 2012-03-15 ENCOUNTER — Other Ambulatory Visit: Payer: Self-pay

## 2012-03-31 ENCOUNTER — Other Ambulatory Visit: Payer: Self-pay

## 2012-03-31 DIAGNOSIS — IMO0001 Reserved for inherently not codable concepts without codable children: Secondary | ICD-10-CM

## 2012-03-31 MED ORDER — NORETHINDRONE 0.35 MG PO TABS: 1.0000 | ORAL_TABLET | Freq: Every day | ORAL | Status: DC

## 2012-08-28 ENCOUNTER — Other Ambulatory Visit: Payer: Self-pay | Admitting: Women's Health

## 2012-11-12 ENCOUNTER — Ambulatory Visit (INDEPENDENT_AMBULATORY_CARE_PROVIDER_SITE_OTHER): Payer: BC Managed Care – PPO | Admitting: Women's Health

## 2012-11-12 ENCOUNTER — Other Ambulatory Visit (HOSPITAL_COMMUNITY)
Admission: RE | Admit: 2012-11-12 | Discharge: 2012-11-12 | Disposition: A | Discharge status: Home / Self Care | Payer: BC Managed Care – PPO | Source: Ambulatory Visit | Attending: Women's Health | Admitting: Women's Health

## 2012-11-12 ENCOUNTER — Encounter: Payer: Self-pay | Admitting: Women's Health

## 2012-11-12 VITALS — BP 116/74 | Ht 63.0 in | Wt 127.4 lb

## 2012-11-12 DIAGNOSIS — B009 Herpesviral infection, unspecified: Secondary | ICD-10-CM

## 2012-11-12 DIAGNOSIS — F17200 Nicotine dependence, unspecified, uncomplicated: Secondary | ICD-10-CM

## 2012-11-12 DIAGNOSIS — Z01419 Encounter for gynecological examination (general) (routine) without abnormal findings: Secondary | ICD-10-CM | POA: Insufficient documentation

## 2012-11-12 DIAGNOSIS — F17210 Nicotine dependence, cigarettes, uncomplicated: Secondary | ICD-10-CM

## 2012-11-12 DIAGNOSIS — Z113 Encounter for screening for infections with a predominantly sexual mode of transmission: Secondary | ICD-10-CM

## 2012-11-12 DIAGNOSIS — Z309 Encounter for contraceptive management, unspecified: Secondary | ICD-10-CM

## 2012-11-12 DIAGNOSIS — Z833 Family history of diabetes mellitus: Secondary | ICD-10-CM

## 2012-11-12 DIAGNOSIS — Z13220 Encounter for screening for lipoid disorders: Secondary | ICD-10-CM

## 2012-11-12 DIAGNOSIS — IMO0001 Reserved for inherently not codable concepts without codable children: Secondary | ICD-10-CM

## 2012-11-12 LAB — CBC WITH DIFFERENTIAL/PLATELET
Basophils Absolute: 0 10*3/uL (ref 0.0–0.1)
Basophils Relative: 1 % (ref 0–1)
Eosinophils Absolute: 0.4 10*3/uL (ref 0.0–0.7)
MCH: 31.3 pg (ref 26.0–34.0)
MCHC: 34 g/dL (ref 30.0–36.0)
Monocytes Relative: 10 % (ref 3–12)
Neutrophils Relative %: 48 % (ref 43–77)
Platelets: 285 10*3/uL (ref 150–400)
RDW: 13 % (ref 11.5–15.5)

## 2012-11-12 LAB — LIPID PANEL
Cholesterol: 164 mg/dL (ref 0–200)
LDL Cholesterol: 88 mg/dL (ref 0–99)
VLDL: 15 mg/dL (ref 0–40)

## 2012-11-12 LAB — HEPATITIS C ANTIBODY: HCV Ab: NEGATIVE

## 2012-11-12 LAB — HEPATITIS B SURFACE ANTIGEN: Hepatitis B Surface Ag: NEGATIVE

## 2012-11-12 MED ORDER — VALACYCLOVIR HCL 500 MG PO TABS: ORAL_TABLET | ORAL | Status: DC

## 2012-11-12 MED ORDER — NORETHIN ACE-ETH ESTRAD-FE 1-20 MG-MCG PO TABS: 1.0000 | ORAL_TABLET | Freq: Every day | ORAL | Status: DC

## 2012-11-12 NOTE — Patient Instructions (Signed)

## 2012-11-12 NOTE — Progress Notes (Signed)
Faith Jacobson 1975/04/19 [id number]    History:    The patient presents for annual exam.  Cycles every month on Micronor/ heavier periods/ same parter. Had been a smoker, has quit and would like to return to combination pills. History of LGSIL 2006/ 2011 C&B- 10/2009 LGSIL CIN-1.  Pap 09/2010 benign reactive changes, Ascus, HPV not done due to lack of cells 03/2011.  History of HSV/ no outbreaks/ controlled with Valtrex as needed    Saw orthopedist for shoulder issues in past year/ Cortisone injection with some relief.  Requests STD screen.   Past medical history, past surgical history, family history and social history were all reviewed and documented in the EPIC chart. Works at [employer], living with boyfriend of 9 months,, wants him to move out/addiction issues.  Mat Aunt - Breast cancer MGF - Diabetes  ROS:  A  ROS was performed and pertinent positives and negatives are included in the history.  Exam:  Filed Vitals:   11/12/12 0842  BP: 116/74    General appearance:  Normal, slim Head/Neck:  Normal, without cervical or supraclavicular adenopathy. Thyroid:  Symmetrical, normal in size, without palpable masses or nodularity. Respiratory  Effort:  Normal  Auscultation:  Clear without wheezing or rhonchi Cardiovascular  Auscultation:  Regular rate, without rubs, murmurs or gallops  Edema/varicosities:  Not grossly evident Abdominal  Soft,nontender, without masses, guarding or rebound.  Liver/spleen:  No organomegaly noted  Hernia:  None appreciated  Skin  Inspection:  Grossly normal  Palpation:  Grossly normal Neurologic/psychiatric  Orientation:  Normal with appropriate conversation.  Mood/affect:  Normal  Genitourinary    Breasts: Examined lying and sitting.     Right: Without masses, retractions, discharge or axillary adenopathy.     Left: Without masses, retractions, discharge or axillary adenopathy.   Inguinal/mons:  Normal without inguinal  adenopathy  External genitalia:  Normal  BUS/Urethra/Skene's glands:  Normal  Bladder:  Normal  Vagina:  Normal  Cervix:  Normal  Uterus:  Normal in size, shape and contour.  Midline and mobile  Adnexa/parametria:     Rt: Without masses or tenderness.   Lt: Without masses or tenderness.  Anus and perineum: Normal  Digital rectal exam: Normal sphincter tone without palpated masses or tenderness  Assessment/Plan:  37 y.o. G0P0 SWF  for annual exam.     LGSIL/CIN-1 2011  - ASCUS 2013 HSV  rare outbreaks  Quit Smoking 04/2012 STD Screen Contraceptive Counseling   Plan: Microgestin FE 1/20, risks of blood clots, strokes reviewed. Reviewed risks of blood clots and strokes if smoking.  Continue Valtrex 500 mg po twice daily for 3-5 days if needed.  SBEs, healthy lifestyle, importance of continued smoking cessation while on birth control, suggested condoms.  RPR, HIV, Hep B/ C, CBC, lipid, glucose, UA, pap.  Harrington Challenger [facility], 9:23 AM 11/12/2012

## 2012-11-13 LAB — URINALYSIS W MICROSCOPIC + REFLEX CULTURE
Glucose, UA: NEGATIVE mg/dL
Hgb urine dipstick: NEGATIVE
Leukocytes, UA: NEGATIVE
Protein, ur: NEGATIVE mg/dL
Urobilinogen, UA: 0.2 mg/dL (ref 0.0–1.0)

## 2012-12-04 ENCOUNTER — Other Ambulatory Visit: Payer: Self-pay

## 2012-12-22 ENCOUNTER — Other Ambulatory Visit: Payer: Self-pay

## 2012-12-22 DIAGNOSIS — B009 Herpesviral infection, unspecified: Secondary | ICD-10-CM

## 2012-12-22 MED ORDER — VALACYCLOVIR HCL 500 MG PO TABS: ORAL_TABLET | ORAL | Status: DC

## 2013-02-19 ENCOUNTER — Other Ambulatory Visit: Payer: Self-pay

## 2013-02-19 DIAGNOSIS — IMO0001 Reserved for inherently not codable concepts without codable children: Secondary | ICD-10-CM

## 2013-02-19 MED ORDER — NORETHIN ACE-ETH ESTRAD-FE 1-20 MG-MCG PO TABS: 1.0000 | ORAL_TABLET | Freq: Every day | ORAL | Status: DC

## 2013-02-23 ENCOUNTER — Other Ambulatory Visit: Payer: Self-pay

## 2013-02-23 DIAGNOSIS — IMO0001 Reserved for inherently not codable concepts without codable children: Secondary | ICD-10-CM

## 2013-02-23 MED ORDER — NORETHIN ACE-ETH ESTRAD-FE 1-20 MG-MCG PO TABS: 1.0000 | ORAL_TABLET | Freq: Every day | ORAL | Status: DC

## 2013-10-11 ENCOUNTER — Other Ambulatory Visit: Payer: Self-pay | Admitting: Women's Health

## 2013-12-24 ENCOUNTER — Other Ambulatory Visit: Payer: Self-pay | Admitting: Women's Health

## 2013-12-31 ENCOUNTER — Encounter: Payer: Self-pay | Admitting: Women's Health

## 2013-12-31 ENCOUNTER — Other Ambulatory Visit (HOSPITAL_COMMUNITY)
Admission: RE | Admit: 2013-12-31 | Discharge: 2013-12-31 | Disposition: A | Discharge status: Home / Self Care | Payer: BC Managed Care – PPO | Source: Ambulatory Visit | Attending: Women's Health | Admitting: Women's Health

## 2013-12-31 ENCOUNTER — Ambulatory Visit (INDEPENDENT_AMBULATORY_CARE_PROVIDER_SITE_OTHER): Payer: BC Managed Care – PPO | Admitting: Women's Health

## 2013-12-31 VITALS — BP 115/78 | Ht 63.0 in | Wt 133.0 lb

## 2013-12-31 DIAGNOSIS — Z113 Encounter for screening for infections with a predominantly sexual mode of transmission: Secondary | ICD-10-CM

## 2013-12-31 DIAGNOSIS — Z01419 Encounter for gynecological examination (general) (routine) without abnormal findings: Secondary | ICD-10-CM | POA: Insufficient documentation

## 2013-12-31 DIAGNOSIS — Z13220 Encounter for screening for lipoid disorders: Secondary | ICD-10-CM

## 2013-12-31 DIAGNOSIS — Z1151 Encounter for screening for human papillomavirus (HPV): Secondary | ICD-10-CM | POA: Insufficient documentation

## 2013-12-31 DIAGNOSIS — B009 Herpesviral infection, unspecified: Secondary | ICD-10-CM

## 2013-12-31 DIAGNOSIS — Z3041 Encounter for surveillance of contraceptive pills: Secondary | ICD-10-CM

## 2013-12-31 LAB — CBC WITH DIFFERENTIAL/PLATELET
Basophils Absolute: 0.1 10*3/uL (ref 0.0–0.1)
Basophils Relative: 1 % (ref 0–1)
EOS ABS: 0.1 10*3/uL (ref 0.0–0.7)
EOS PCT: 1 % (ref 0–5)
HCT: 43.8 % (ref 36.0–46.0)
Hemoglobin: 14.7 g/dL (ref 12.0–15.0)
LYMPHS ABS: 2.4 10*3/uL (ref 0.7–4.0)
LYMPHS PCT: 23 % (ref 12–46)
MCH: 31.5 pg (ref 26.0–34.0)
MCHC: 33.6 g/dL (ref 30.0–36.0)
MCV: 93.8 fL (ref 78.0–100.0)
MPV: 10.3 fL (ref 9.4–12.4)
Monocytes Absolute: 0.5 10*3/uL (ref 0.1–1.0)
Monocytes Relative: 5 % (ref 3–12)
Neutro Abs: 7.4 10*3/uL (ref 1.7–7.7)
Neutrophils Relative %: 70 % (ref 43–77)
Platelets: 282 10*3/uL (ref 150–400)
RBC: 4.67 MIL/uL (ref 3.87–5.11)
RDW: 13.5 % (ref 11.5–15.5)
WBC: 10.5 10*3/uL (ref 4.0–10.5)

## 2013-12-31 LAB — LIPID PANEL
Cholesterol: 200 mg/dL (ref 0–200)
HDL: 82 mg/dL (ref >39)
LDL CALC: 99 mg/dL (ref 0–99)
Total CHOL/HDL Ratio: 2.4 Ratio
Triglycerides: 95 mg/dL (ref <150)
VLDL: 19 mg/dL (ref 0–40)

## 2013-12-31 LAB — GLUCOSE, RANDOM: Glucose, Bld: 74 mg/dL (ref 70–99)

## 2013-12-31 MED ORDER — NORETHINDRONE 0.35 MG PO TABS: 1.0000 | ORAL_TABLET | Freq: Every day | ORAL | Status: DC

## 2013-12-31 MED ORDER — VALACYCLOVIR HCL 500 MG PO TABS: ORAL_TABLET | ORAL | Status: DC

## 2013-12-31 NOTE — Progress Notes (Signed)
Faith Jacobson [birth year] [id number]    History:    Presents for annual exam.  Regular monthly cycle/condoms. Had quit smoking but is now smoking several cigarettes daily. 2006 LGSIL, 2011 LGSIL CIN-1 with normal Paps after. HSV rare outbreaks. New partner. Desires no children.  Past medical history, past surgical history, family history and social history were all reviewed and documented in the EPIC chart. Works for [employer]. Mother and several siblings history of drug addiction/ alcoholism.  ROS:  A  12 point ROS was performed and pertinent positives and negatives are included.  Exam:  Filed Vitals:   12/31/13 1204  BP: 115/78    General appearance:  Normal Thyroid:  Symmetrical, normal in size, without palpable masses or nodularity. Respiratory  Auscultation:  Clear without wheezing or rhonchi Cardiovascular  Auscultation:  Regular rate, without rubs, murmurs or gallops  Edema/varicosities:  Not grossly evident Abdominal  Soft,nontender, without masses, guarding or rebound.  Liver/spleen:  No organomegaly noted  Hernia:  None appreciated  Skin  Inspection:  Grossly normal   Breasts: Examined lying and sitting.     Right: Without masses, retractions, discharge or axillary adenopathy.     Left: Without masses, retractions, discharge or axillary adenopathy. Gentitourinary   Inguinal/mons:  Normal without inguinal adenopathy  External genitalia:  Normal  BUS/Urethra/Skene's glands:  Normal  Vagina:  Normal  Cervix:  Normal  Uterus:   normal in size, shape and contour.  Midline and mobile  Adnexa/parametria:     Rt: Without masses or tenderness.   Lt: Without masses or tenderness.  Anus and perineum: Normal  Digital rectal exam: Normal sphincter tone without palpated masses or tenderness  Assessment/Plan:  38 y.o. S WF G0 for annual exam with no complaints.  Smoker Monthly cycle/condoms 2006, 2011 LGSIL HSV  Plan: Aware of hazards of smoking, trying to  cut down to quit. SBE's, continue regular exercise, calcium rich diet, MVI daily. Contraception reviewed, will try Micronor prescription, proper use given and reviewed importance of taking daily at the same time, no placebo week, condoms especially first month and for infection control. Valtrex 500 twice daily for 3-5 days as needed prescription, proper use given and reviewed. CBC, glucose, lipid panel, UA, Pap with HR HPV typing, GC/Chlamydia, HIV, hep B, C, RPRHuel Cote WHNP, 12:56 PM 12/31/2013

## 2013-12-31 NOTE — Addendum Note (Signed)
Addended by: Burnett Kanaris on: 12/31/2013 03:23 PM   Modules accepted: Orders, SmartSet

## 2013-12-31 NOTE — Patient Instructions (Signed)

## 2014-01-01 LAB — URINALYSIS W MICROSCOPIC + REFLEX CULTURE
Bacteria, UA: NONE SEEN
Bilirubin Urine: NEGATIVE
CASTS: NONE SEEN
CRYSTALS: NONE SEEN
GLUCOSE, UA: NEGATIVE mg/dL
HGB URINE DIPSTICK: NEGATIVE
KETONES UR: NEGATIVE mg/dL
LEUKOCYTES UA: NEGATIVE
NITRITE: NEGATIVE
Protein, ur: NEGATIVE mg/dL
SPECIFIC GRAVITY, URINE: 1.023 (ref 1.005–1.030)
Squamous Epithelial / LPF: NONE SEEN
Urobilinogen, UA: 0.2 mg/dL (ref 0.0–1.0)
pH: 7 (ref 5.0–8.0)

## 2014-01-01 LAB — RPR

## 2014-01-01 LAB — GC/CHLAMYDIA PROBE AMP
CT Probe RNA: NEGATIVE
GC Probe RNA: NEGATIVE

## 2014-01-01 LAB — HEPATITIS B SURFACE ANTIGEN: Hepatitis B Surface Ag: NEGATIVE

## 2014-01-01 LAB — HIV ANTIBODY (ROUTINE TESTING W REFLEX): HIV 1&2 Ab, 4th Generation: NONREACTIVE

## 2014-01-01 LAB — HEPATITIS C ANTIBODY: HCV Ab: NEGATIVE

## 2014-01-05 LAB — CYTOLOGY - PAP

## 2014-02-16 ENCOUNTER — Telehealth: Payer: Self-pay | Admitting: *Deleted

## 2014-02-16 DIAGNOSIS — Z3041 Encounter for surveillance of contraceptive pills: Secondary | ICD-10-CM

## 2014-02-16 MED ORDER — NORETHINDRONE 0.35 MG PO TABS: 1.0000 | ORAL_TABLET | Freq: Every day | ORAL | Status: DC

## 2014-02-16 NOTE — Telephone Encounter (Signed)
Pt called requesting a 3 month supply for birth control [facility] [place] 12/31/13. Rx sent.

## 2014-07-30 ENCOUNTER — Telehealth: Payer: Self-pay | Admitting: *Deleted

## 2014-07-30 MED ORDER — VALACYCLOVIR HCL 500 MG PO TABS: 500.0000 mg | ORAL_TABLET | Freq: Two times a day (BID) | ORAL | Status: DC

## 2014-07-30 NOTE — Telephone Encounter (Signed)
Pt called back the number and name of pharmacy. Rx sent. Pt left her Rx at home

## 2014-07-30 NOTE — Telephone Encounter (Signed)
Pt in [place] needs refill on Valtrex 500 mg, I left message for pt to call with name of pharmacy and number.

## 2014-10-29 ENCOUNTER — Ambulatory Visit: Payer: Self-pay | Admitting: Women's Health

## 2014-11-02 ENCOUNTER — Encounter: Payer: Self-pay | Admitting: Women's Health

## 2014-11-02 ENCOUNTER — Ambulatory Visit (INDEPENDENT_AMBULATORY_CARE_PROVIDER_SITE_OTHER): Payer: BLUE CROSS/BLUE SHIELD | Admitting: Women's Health

## 2014-11-02 VITALS — BP 116/76

## 2014-11-02 DIAGNOSIS — O3680X0 Pregnancy with inconclusive fetal viability, not applicable or unspecified: Secondary | ICD-10-CM | POA: Diagnosis not present

## 2014-11-02 DIAGNOSIS — N912 Amenorrhea, unspecified: Secondary | ICD-10-CM | POA: Diagnosis not present

## 2014-11-02 DIAGNOSIS — F17210 Nicotine dependence, cigarettes, uncomplicated: Secondary | ICD-10-CM

## 2014-11-02 DIAGNOSIS — Z113 Encounter for screening for infections with a predominantly sexual mode of transmission: Secondary | ICD-10-CM | POA: Diagnosis not present

## 2014-11-02 LAB — HEPATITIS C ANTIBODY: HCV Ab: NEGATIVE

## 2014-11-02 LAB — HEPATITIS B SURFACE ANTIGEN: HEP B S AG: NEGATIVE

## 2014-11-02 LAB — PREGNANCY, URINE: Preg Test, Ur: POSITIVE

## 2014-11-02 NOTE — Progress Notes (Signed)
Patient ID: Faith Jacobson, female   DOB: 1975/10/07, 39 y.o.   MRN: [id number] Presents with amenorrhea for the last 3 months after discontinuing OCP's, negative home UPT 2 weeks ago.  New partner in June reports he was told he with [place] with no confirmatory sperm count.  Denies nausea, breast tenderness but has had increased emotions in the past few weeks.  Exam:  Appears well.  UPT positive.  External genitalia normal.  Speculum exam reveals non-erythematous vaginal walls and cervix, no discharge noted.  Bimanual exam reveals uterus 6-8 weeks size.  GC/Chlamydia culture pending.  Positive UPT STI screen Smoker  Plan:  Schedule viability/dating ultrasound, check quantitative HCG, GC and Chlamydia.  Begin taking daily prenatal vitamins, samples given.  Counseled to quit smoking,no alcohol, avoid over-the-counter meds, safe pregnancy behaviors reviewed..  Reviewed we no longer deliver will refer after ultrasound.

## 2014-11-02 NOTE — Patient Instructions (Signed)
First Trimester of Pregnancy The first trimester of pregnancy is from week 1 until the end of week 12 (months 1 through 3). A week after a sperm fertilizes an egg, the egg will implant on the wall of the uterus. This embryo will begin to develop into a baby. Genes from you and your partner are forming the baby. The female genes determine whether the baby is a boy or a girl. At 6-8 weeks, the eyes and face are formed, and the heartbeat can be seen on ultrasound. At the end of 12 weeks, all the baby's organs are formed.  Now that you are pregnant, you will want to do everything you can to have a healthy baby. Two of the most important things are to get good prenatal care and to follow your health care provider's instructions. Prenatal care is all the medical care you receive before the baby's birth. This care will help prevent, find, and treat any problems during the pregnancy and childbirth. BODY CHANGES Your body goes through many changes during pregnancy. The changes vary from woman to woman.   You may gain or lose a couple of pounds at first.  You may feel sick to your stomach (nauseous) and throw up (vomit). If the vomiting is uncontrollable, call your health care provider.  You may tire easily.  You may develop headaches that can be relieved by medicines approved by your health care provider.  You may urinate more often. Painful urination may mean you have a bladder infection.  You may develop heartburn as a result of your pregnancy.  You may develop constipation because certain hormones are causing the muscles that push waste through your intestines to slow down.  You may develop hemorrhoids or swollen, bulging veins (varicose veins).  Your breasts may begin to grow larger and become tender. Your nipples may stick out more, and the tissue that surrounds them (areola) may become darker.  Your gums may bleed and may be sensitive to brushing and flossing.  Dark spots or blotches (chloasma,  mask of pregnancy) may develop on your face. This will likely fade after the baby is born.  Your menstrual periods will stop.  You may have a loss of appetite.  You may develop cravings for certain kinds of food.  You may have changes in your emotions from day to day, such as being excited to be pregnant or being concerned that something may go wrong with the pregnancy and baby.  You may have more vivid and strange dreams.  You may have changes in your hair. These can include thickening of your hair, rapid growth, and changes in texture. Some women also have hair loss during or after pregnancy, or hair that feels dry or thin. Your hair will most likely return to normal after your baby is born. WHAT TO EXPECT AT YOUR PRENATAL VISITS During a routine prenatal visit:  You will be weighed to make sure you and the baby are growing normally.  Your blood pressure will be taken.  Your abdomen will be measured to track your baby's growth.  The fetal heartbeat will be listened to starting around week 10 or 12 of your pregnancy.  Test results from any previous visits will be discussed. Your health care provider may ask you:  How you are feeling.  If you are feeling the baby move.  If you have had any abnormal symptoms, such as leaking fluid, bleeding, severe headaches, or abdominal cramping.  If you have any questions. Other tests   that may be performed during your first trimester include:  Blood tests to find your blood type and to check for the presence of any previous infections. They will also be used to check for low iron levels (anemia) and Rh antibodies. Later in the pregnancy, blood tests for diabetes will be done along with other tests if problems develop.  Urine tests to check for infections, diabetes, or protein in the urine.  An ultrasound to confirm the proper growth and development of the baby.  An amniocentesis to check for possible genetic problems.  Fetal screens for  spina bifida and Down syndrome.  You may need other tests to make sure you and the baby are doing well. HOME CARE INSTRUCTIONS  Medicines  Follow your health care provider's instructions regarding medicine use. Specific medicines may be either safe or unsafe to take during pregnancy.  Take your prenatal vitamins as directed.  If you develop constipation, try taking a stool softener if your health care provider approves. Diet  Eat regular, well-balanced meals. Choose a variety of foods, such as meat or vegetable-based protein, fish, milk and low-fat dairy products, vegetables, fruits, and whole grain breads and cereals. Your health care provider will help you determine the amount of weight gain that is right for you.  Avoid raw meat and uncooked cheese. These carry germs that can cause birth defects in the baby.  Eating four or five small meals rather than three large meals a day may help relieve nausea and vomiting. If you start to feel nauseous, eating a few soda crackers can be helpful. Drinking liquids between meals instead of during meals also seems to help nausea and vomiting.  If you develop constipation, eat more high-fiber foods, such as fresh vegetables or fruit and whole grains. Drink enough fluids to keep your urine clear or pale yellow. Activity and Exercise  Exercise only as directed by your health care provider. Exercising will help you:  Control your weight.  Stay in shape.  Be prepared for labor and delivery.  Experiencing pain or cramping in the lower abdomen or low back is a good sign that you should stop exercising. Check with your health care provider before continuing normal exercises.  Try to avoid standing for long periods of time. Move your legs often if you must stand in one place for a long time.  Avoid heavy lifting.  Wear low-heeled shoes, and practice good posture.  You may continue to have sex unless your health care provider directs you  otherwise. Relief of Pain or Discomfort  Wear a good support bra for breast tenderness.   Take warm sitz baths to soothe any pain or discomfort caused by hemorrhoids. Use hemorrhoid cream if your health care provider approves.   Rest with your legs elevated if you have leg cramps or low back pain.  If you develop varicose veins in your legs, wear support hose. Elevate your feet for 15 minutes, 3-4 times a day. Limit salt in your diet. Prenatal Care  Schedule your prenatal visits by the twelfth week of pregnancy. They are usually scheduled monthly at first, then more often in the last 2 months before delivery.  Write down your questions. Take them to your prenatal visits.  Keep all your prenatal visits as directed by your health care provider. Safety  Wear your seat belt at all times when driving.  Make a list of emergency phone numbers, including numbers for family, friends, the hospital, and police and fire departments. General Tips    Ask your health care provider for a referral to a local prenatal education class. Begin classes no later than at the beginning of month 6 of your pregnancy.  Ask for help if you have counseling or nutritional needs during pregnancy. Your health care provider can offer advice or refer you to specialists for help with various needs.  Do not use hot tubs, steam rooms, or saunas.  Do not douche or use tampons or scented sanitary pads.  Do not cross your legs for long periods of time.  Avoid cat litter boxes and soil used by cats. These carry germs that can cause birth defects in the baby and possibly loss of the fetus by miscarriage or stillbirth.  Avoid all smoking, herbs, alcohol, and medicines not prescribed by your health care provider. Chemicals in these affect the formation and growth of the baby.  Schedule a dentist appointment. At home, brush your teeth with a soft toothbrush and be gentle when you floss. SEEK MEDICAL CARE IF:   You have  dizziness.  You have mild pelvic cramps, pelvic pressure, or nagging pain in the abdominal area.  You have persistent nausea, vomiting, or diarrhea.  You have a bad smelling vaginal discharge.  You have pain with urination.  You notice increased swelling in your face, hands, legs, or ankles. SEEK IMMEDIATE MEDICAL CARE IF:   You have a fever.  You are leaking fluid from your vagina.  You have spotting or bleeding from your vagina.  You have severe abdominal cramping or pain.  You have rapid weight gain or loss.  You vomit blood or material that looks like coffee grounds.  You are exposed to German measles and have never had them.  You are exposed to fifth disease or chickenpox.  You develop a severe headache.  You have shortness of breath.  You have any kind of trauma, such as from a fall or a car accident. Document Released: 01/09/2001 Document Revised: 06/01/2013 Document Reviewed: 11/25/2012 Exit[employer] Patient Information 2015 [employer]. This information is not intended to replace advice given to you by your health care provider. Make sure you discuss any questions you have with your health care provider.  

## 2014-11-03 ENCOUNTER — Encounter: Payer: Self-pay | Admitting: Women's Health

## 2014-11-03 LAB — HCG, QUANTITATIVE, PREGNANCY: hCG, Beta Chain, Quant, S: 2436.5 m[IU]/mL

## 2014-11-03 LAB — HIV ANTIBODY (ROUTINE TESTING W REFLEX): HIV 1&2 Ab, 4th Generation: NONREACTIVE

## 2014-11-03 LAB — GC/CHLAMYDIA PROBE AMP
CT Probe RNA: NEGATIVE
GC Probe RNA: NEGATIVE

## 2014-11-03 LAB — RPR

## 2014-11-05 ENCOUNTER — Ambulatory Visit (INDEPENDENT_AMBULATORY_CARE_PROVIDER_SITE_OTHER): Payer: BLUE CROSS/BLUE SHIELD

## 2014-11-05 ENCOUNTER — Ambulatory Visit (INDEPENDENT_AMBULATORY_CARE_PROVIDER_SITE_OTHER): Payer: BLUE CROSS/BLUE SHIELD | Admitting: Women's Health

## 2014-11-05 DIAGNOSIS — O3680X0 Pregnancy with inconclusive fetal viability, not applicable or unspecified: Secondary | ICD-10-CM

## 2014-11-05 NOTE — Progress Notes (Signed)
Patient ID: Faith Jacobson, female   DOB: 14-Feb-1975, 39 y.o.   MRN: [id number] Presents today for viability ultrasound.  LMP 08/02/2014, stopped OCs in June. Seen 10/4 with + UPT after having a - home UPT 2 weeks prior.  Had minimal spotting last night, with none today.  Is working to quit smoking, has decreased by half since earlier this week.      Exam: appears well U/S:  Single uterine gestation sac seen with a yolk sac.  A fetal pole seen, but no FHT seen yet.  Ovaries appear normal with a CLC seen on RT ovary.  No free fluid seen.  T/V images.    Early pregnancy/ 5 weeks 5 days size  Plan:  Repeat U/S in two weeks.  Discussed safe pregnancy behaviors, continue smoking cessation efforts as a couple, do not advise riding motorcycle during pregnancy.  Call with any concerns or with bleeding.

## 2014-11-05 NOTE — Patient Instructions (Signed)
First Trimester of Pregnancy The first trimester of pregnancy is from week 1 until the end of week 12 (months 1 through 3). A week after a sperm fertilizes an egg, the egg will implant on the wall of the uterus. This embryo will begin to develop into a baby. Genes from you and your partner are forming the baby. The female genes determine whether the baby is a boy or a girl. At 6-8 weeks, the eyes and face are formed, and the heartbeat can be seen on ultrasound. At the end of 12 weeks, all the baby's organs are formed.  Now that you are pregnant, you will want to do everything you can to have a healthy baby. Two of the most important things are to get good prenatal care and to follow your health care provider's instructions. Prenatal care is all the medical care you receive before the baby's birth. This care will help prevent, find, and treat any problems during the pregnancy and childbirth. BODY CHANGES Your body goes through many changes during pregnancy. The changes vary from woman to woman.   You may gain or lose a couple of pounds at first.  You may feel sick to your stomach (nauseous) and throw up (vomit). If the vomiting is uncontrollable, call your health care provider.  You may tire easily.  You may develop headaches that can be relieved by medicines approved by your health care provider.  You may urinate more often. Painful urination may mean you have a bladder infection.  You may develop heartburn as a result of your pregnancy.  You may develop constipation because certain hormones are causing the muscles that push waste through your intestines to slow down.  You may develop hemorrhoids or swollen, bulging veins (varicose veins).  Your breasts may begin to grow larger and become tender. Your nipples may stick out more, and the tissue that surrounds them (areola) may become darker.  Your gums may bleed and may be sensitive to brushing and flossing.  Dark spots or blotches (chloasma,  mask of pregnancy) may develop on your face. This will likely fade after the baby is born.  Your menstrual periods will stop.  You may have a loss of appetite.  You may develop cravings for certain kinds of food.  You may have changes in your emotions from day to day, such as being excited to be pregnant or being concerned that something may go wrong with the pregnancy and baby.  You may have more vivid and strange dreams.  You may have changes in your hair. These can include thickening of your hair, rapid growth, and changes in texture. Some women also have hair loss during or after pregnancy, or hair that feels dry or thin. Your hair will most likely return to normal after your baby is born. WHAT TO EXPECT AT YOUR PRENATAL VISITS During a routine prenatal visit:  You will be weighed to make sure you and the baby are growing normally.  Your blood pressure will be taken.  Your abdomen will be measured to track your baby's growth.  The fetal heartbeat will be listened to starting around week 10 or 12 of your pregnancy.  Test results from any previous visits will be discussed. Your health care provider may ask you:  How you are feeling.  If you are feeling the baby move.  If you have had any abnormal symptoms, such as leaking fluid, bleeding, severe headaches, or abdominal cramping.  If you are using any tobacco products,   including cigarettes, chewing tobacco, and electronic cigarettes.  If you have any questions. Other tests that may be performed during your first trimester include:  Blood tests to find your blood type and to check for the presence of any previous infections. They will also be used to check for low iron levels (anemia) and Rh antibodies. Later in the pregnancy, blood tests for diabetes will be done along with other tests if problems develop.  Urine tests to check for infections, diabetes, or protein in the urine.  An ultrasound to confirm the proper growth  and development of the baby.  An amniocentesis to check for possible genetic problems.  Fetal screens for spina bifida and Down syndrome.  You may need other tests to make sure you and the baby are doing well.  HIV (human immunodeficiency virus) testing. Routine prenatal testing includes screening for HIV, unless you choose not to have this test. HOME CARE INSTRUCTIONS  Medicines  Follow your health care provider's instructions regarding medicine use. Specific medicines may be either safe or unsafe to take during pregnancy.  Take your prenatal vitamins as directed.  If you develop constipation, try taking a stool softener if your health care provider approves. Diet  Eat regular, well-balanced meals. Choose a variety of foods, such as meat or vegetable-based protein, fish, milk and low-fat dairy products, vegetables, fruits, and whole grain breads and cereals. Your health care provider will help you determine the amount of weight gain that is right for you.  Avoid raw meat and uncooked cheese. These carry germs that can cause birth defects in the baby.  Eating four or five small meals rather than three large meals a day may help relieve nausea and vomiting. If you start to feel nauseous, eating a few soda crackers can be helpful. Drinking liquids between meals instead of during meals also seems to help nausea and vomiting.  If you develop constipation, eat more high-fiber foods, such as fresh vegetables or fruit and whole grains. Drink enough fluids to keep your urine clear or pale yellow. Activity and Exercise  Exercise only as directed by your health care provider. Exercising will help you:  Control your weight.  Stay in shape.  Be prepared for labor and delivery.  Experiencing pain or cramping in the lower abdomen or low back is a good sign that you should stop exercising. Check with your health care provider before continuing normal exercises.  Try to avoid standing for long  periods of time. Move your legs often if you must stand in one place for a long time.  Avoid heavy lifting.  Wear low-heeled shoes, and practice good posture.  You may continue to have sex unless your health care provider directs you otherwise. Relief of Pain or Discomfort  Wear a good support bra for breast tenderness.   Take warm sitz baths to soothe any pain or discomfort caused by hemorrhoids. Use hemorrhoid cream if your health care provider approves.   Rest with your legs elevated if you have leg cramps or low back pain.  If you develop varicose veins in your legs, wear support hose. Elevate your feet for 15 minutes, 3-4 times a day. Limit salt in your diet. Prenatal Care  Schedule your prenatal visits by the twelfth week of pregnancy. They are usually scheduled monthly at first, then more often in the last 2 months before delivery.  Write down your questions. Take them to your prenatal visits.  Keep all your prenatal visits as directed by your   health care provider. Safety  Wear your seat belt at all times when driving.  Make a list of emergency phone numbers, including numbers for family, friends, the hospital, and police and fire departments. General Tips  Ask your health care provider for a referral to a local prenatal education class. Begin classes no later than at the beginning of month 6 of your pregnancy.  Ask for help if you have counseling or nutritional needs during pregnancy. Your health care provider can offer advice or refer you to specialists for help with various needs.  Do not use hot tubs, steam rooms, or saunas.  Do not douche or use tampons or scented sanitary pads.  Do not cross your legs for long periods of time.  Avoid cat litter boxes and soil used by cats. These carry germs that can cause birth defects in the baby and possibly loss of the fetus by miscarriage or stillbirth.  Avoid all smoking, herbs, alcohol, and medicines not prescribed by  your health care provider. Chemicals in these affect the formation and growth of the baby.  Do not use any tobacco products, including cigarettes, chewing tobacco, and electronic cigarettes. If you need help quitting, ask your health care provider. You may receive counseling support and other resources to help you quit.  Schedule a dentist appointment. At home, brush your teeth with a soft toothbrush and be gentle when you floss. SEEK MEDICAL CARE IF:   You have dizziness.  You have mild pelvic cramps, pelvic pressure, or nagging pain in the abdominal area.  You have persistent nausea, vomiting, or diarrhea.  You have a bad smelling vaginal discharge.  You have pain with urination.  You notice increased swelling in your face, hands, legs, or ankles. SEEK IMMEDIATE MEDICAL CARE IF:   You have a fever.  You are leaking fluid from your vagina.  You have spotting or bleeding from your vagina.  You have severe abdominal cramping or pain.  You have rapid weight gain or loss.  You vomit blood or material that looks like coffee grounds.  You are exposed to German measles and have never had them.  You are exposed to fifth disease or chickenpox.  You develop a severe headache.  You have shortness of breath.  You have any kind of trauma, such as from a fall or a car accident.   This information is not intended to replace advice given to you by your health care provider. Make sure you discuss any questions you have with your health care provider.   Document Released: 01/09/2001 Document Revised: 02/05/2014 Document Reviewed: 11/25/2012 Elsevier Interactive Patient Education 2016 [employer]  

## 2014-11-17 ENCOUNTER — Ambulatory Visit (INDEPENDENT_AMBULATORY_CARE_PROVIDER_SITE_OTHER): Payer: BLUE CROSS/BLUE SHIELD | Admitting: Women's Health

## 2014-11-17 ENCOUNTER — Ambulatory Visit (INDEPENDENT_AMBULATORY_CARE_PROVIDER_SITE_OTHER): Payer: BLUE CROSS/BLUE SHIELD

## 2014-11-17 ENCOUNTER — Other Ambulatory Visit: Payer: Self-pay | Admitting: Women's Health

## 2014-11-17 VITALS — BP 110/80 | Ht 63.0 in | Wt 133.0 lb

## 2014-11-17 DIAGNOSIS — N920 Excessive and frequent menstruation with regular cycle: Secondary | ICD-10-CM | POA: Diagnosis not present

## 2014-11-17 DIAGNOSIS — O3680X0 Pregnancy with inconclusive fetal viability, not applicable or unspecified: Secondary | ICD-10-CM

## 2014-11-17 DIAGNOSIS — O09511 Supervision of elderly primigravida, first trimester: Secondary | ICD-10-CM

## 2014-11-17 NOTE — Progress Notes (Signed)
Presents today for follow-up OB transvaginal ultrasound.Last viability [place] on 11/02/14 showed S<D single uterine gestation sac with yolk sac, fetal pole, but no FHT. LMP 08/02/2014. Denies nausea, abdominal pain, or bleeding. Is more fatigued. Taking prenatal vitamin daily.  Exam: Appears well.  U/S: Single uterine gestation sac seen with a yolk sac. A fetal pole seen. CRL of [redacted]w[redacted]d. FHT at 136 bpm. Ovaries appear normal with a CLC seen on RT ovary. Cervix appears long and closed. No free fluid seen.R/L adnexal normal.   Pregnancy at [redacted]w[redacted]d  Plan:Discussed safe pregnancy behaviors, continue smoking cessation efforts as a couple, do not advise riding motorcycle during pregnancy. Call with any concerns or with bleeding. Schedule appointment with OB/GYN physician. Congratulations given

## 2014-12-03 LAB — OB RESULTS CONSOLE ABO/RH: RH TYPE: POSITIVE

## 2014-12-03 LAB — OB RESULTS CONSOLE RUBELLA ANTIBODY, IGM: RUBELLA: NON-IMMUNE/NOT IMMUNE

## 2014-12-03 LAB — OB RESULTS CONSOLE HIV ANTIBODY (ROUTINE TESTING): HIV: NONREACTIVE

## 2014-12-03 LAB — OB RESULTS CONSOLE GC/CHLAMYDIA
Chlamydia: NEGATIVE
GC PROBE AMP, GENITAL: NEGATIVE

## 2014-12-03 LAB — OB RESULTS CONSOLE RPR: RPR: NONREACTIVE

## 2014-12-03 LAB — OB RESULTS CONSOLE HEPATITIS B SURFACE ANTIGEN: Hepatitis B Surface Ag: NEGATIVE

## 2014-12-03 LAB — OB RESULTS CONSOLE ANTIBODY SCREEN: ANTIBODY SCREEN: NEGATIVE

## 2015-01-07 ENCOUNTER — Encounter: Payer: Self-pay | Admitting: Women's Health

## 2015-04-30 ENCOUNTER — Inpatient Hospital Stay (HOSPITAL_COMMUNITY): Admission: AD | Admit: 2015-04-30 | Payer: Self-pay | Source: Ambulatory Visit | Admitting: Obstetrics and Gynecology

## 2015-06-06 ENCOUNTER — Encounter (HOSPITAL_COMMUNITY): Payer: Self-pay | Admitting: *Deleted

## 2015-06-06 ENCOUNTER — Telehealth (HOSPITAL_COMMUNITY): Payer: Self-pay | Admitting: *Deleted

## 2015-06-06 MED ORDER — TERBUTALINE SULFATE 1 MG/ML IJ SOLN
0.2500 mg | Freq: Once | INTRAMUSCULAR | Status: AC
  Administered 2015-06-08: 0.25 mg via SUBCUTANEOUS
  Filled 2015-06-06 (×2): qty 1

## 2015-06-06 NOTE — Telephone Encounter (Signed)
Preadmission screen  

## 2015-06-08 ENCOUNTER — Observation Stay (HOSPITAL_COMMUNITY)
Admission: RE | Admit: 2015-06-08 | Discharge: 2015-06-08 | Disposition: A | Discharge status: Home / Self Care | Payer: BLUE CROSS/BLUE SHIELD | Source: Ambulatory Visit | Attending: Obstetrics and Gynecology | Admitting: Obstetrics and Gynecology

## 2015-06-08 ENCOUNTER — Encounter (HOSPITAL_COMMUNITY): Payer: Self-pay

## 2015-06-08 DIAGNOSIS — Z3A00 Weeks of gestation of pregnancy not specified: Secondary | ICD-10-CM | POA: Diagnosis not present

## 2015-06-08 DIAGNOSIS — O322XX0 Maternal care for transverse and oblique lie, not applicable or unspecified: Principal | ICD-10-CM | POA: Insufficient documentation

## 2015-06-08 MED ORDER — LACTATED RINGERS IV SOLN
INTRAVENOUS | Status: DC
  Administered 2015-06-08: 08:00:00 via INTRAVENOUS

## 2015-06-08 NOTE — Progress Notes (Signed)
D/c instructions given, questions answered, pt states understanding, signs and given copy. Ambulates off unit with family at side

## 2015-06-08 NOTE — Procedures (Signed)
Ultrasound was used to confirm oblique position.  Fetal vertex at maternal LUQ.  Risks of version discussed & informed consent obtained.  NST reactive, terbulaline given.  Steady pressure applied to back of fetal head while pushing up on fetal bottom in attempt to get infant to do forward roll.  No movement noted.  FHT checked and reassuring after each attempt.  3 attempts with no movement.  Rec to discontinue procedure and pt agrees.  Will repeat a NST and discharge home if stable.

## 2015-06-20 ENCOUNTER — Encounter (HOSPITAL_COMMUNITY): Payer: Self-pay

## 2015-06-29 ENCOUNTER — Encounter (HOSPITAL_COMMUNITY): Payer: Self-pay

## 2015-06-29 ENCOUNTER — Encounter (HOSPITAL_COMMUNITY)
Admission: RE | Admit: 2015-06-29 | Discharge: 2015-06-29 | Disposition: A | Discharge status: Home / Self Care | Payer: BLUE CROSS/BLUE SHIELD | Source: Ambulatory Visit | Attending: Obstetrics and Gynecology | Admitting: Obstetrics and Gynecology

## 2015-06-29 LAB — CBC
HCT: 35 % — ABNORMAL LOW (ref 36.0–46.0)
HEMOGLOBIN: 12 g/dL (ref 12.0–15.0)
MCH: 31.6 pg (ref 26.0–34.0)
MCHC: 34.3 g/dL (ref 30.0–36.0)
MCV: 92.1 fL (ref 78.0–100.0)
PLATELETS: 276 10*3/uL (ref 150–400)
RBC: 3.8 MIL/uL — AB (ref 3.87–5.11)
RDW: 13.9 % (ref 11.5–15.5)
WBC: 16.7 10*3/uL — AB (ref 4.0–10.5)

## 2015-06-29 LAB — TYPE AND SCREEN
ABO/RH(D): A POS
ANTIBODY SCREEN: NEGATIVE

## 2015-06-29 LAB — ABO/RH: ABO/RH(D): A POS

## 2015-06-29 NOTE — Patient Instructions (Signed)
[place]  06/29/2015   Your procedure is scheduled on:  06/30/2015  Enter through the Main Entrance of [facility] [place] up the phone at the desk and dial [phone].   Call this number if you have problems the morning of surgery: [phone]   Remember:   Do not eat food:After Midnight.  Do not drink clear liquids: After Midnight.  Take these medicines the morning of surgery with A SIP OF WATER: none   Do not wear jewelry, make-up or nail polish.  Do not wear lotions, powders, or perfumes. You may wear deodorant.  Do not shave 48 hours prior to surgery.  Do not bring valuables to the hospital.  [facility] is not   responsible for any belongings or valuables brought to the hospital.  Contacts, dentures or bridgework may not be worn into surgery.  Leave suitcase in the car. After surgery it may be brought to your room.  For patients admitted to the hospital, checkout time is 11:00 AM the day of              discharge.   Patients discharged the day of surgery will not be allowed to drive             home.  Name and phone number of your driver: na  Special Instructions:   Shower using CHG 2 nights before surgery and the night before surgery.  If you shower the day of surgery use CHG.  Use special wash - you have one bottle of CHG for all showers.  You should use approximately 1/3 of the bottle for each shower.   Please read over the following fact sheets that you were given:   Surgical Site Infection Prevention

## 2015-06-30 ENCOUNTER — Encounter (HOSPITAL_COMMUNITY)
Admission: AD | Disposition: A | Discharge status: Home / Self Care | Payer: Self-pay | Source: Ambulatory Visit | Attending: Obstetrics and Gynecology

## 2015-06-30 ENCOUNTER — Inpatient Hospital Stay (HOSPITAL_COMMUNITY): Payer: BLUE CROSS/BLUE SHIELD | Admitting: Anesthesiology

## 2015-06-30 ENCOUNTER — Inpatient Hospital Stay (HOSPITAL_COMMUNITY)
Admission: RE | Admit: 2015-06-30 | Payer: BLUE CROSS/BLUE SHIELD | Source: Ambulatory Visit | Admitting: Obstetrics and Gynecology

## 2015-06-30 ENCOUNTER — Encounter (HOSPITAL_COMMUNITY): Payer: Self-pay | Admitting: *Deleted

## 2015-06-30 ENCOUNTER — Inpatient Hospital Stay (HOSPITAL_COMMUNITY)
Admission: AD | Admit: 2015-06-30 | Discharge: 2015-07-02 | DRG: 766 | Disposition: A | Discharge status: Home / Self Care | Payer: BLUE CROSS/BLUE SHIELD | Source: Ambulatory Visit | Attending: Obstetrics and Gynecology | Admitting: Obstetrics and Gynecology

## 2015-06-30 ENCOUNTER — Inpatient Hospital Stay (HOSPITAL_COMMUNITY)
Admission: AD | Disposition: A | Discharge status: Home / Self Care | Payer: Self-pay | Source: Ambulatory Visit | Attending: Obstetrics and Gynecology

## 2015-06-30 DIAGNOSIS — Z87410 Personal history of cervical dysplasia: Secondary | ICD-10-CM

## 2015-06-30 DIAGNOSIS — O99334 Smoking (tobacco) complicating childbirth: Secondary | ICD-10-CM | POA: Diagnosis present

## 2015-06-30 DIAGNOSIS — Z3A39 39 weeks gestation of pregnancy: Secondary | ICD-10-CM | POA: Diagnosis not present

## 2015-06-30 DIAGNOSIS — Z8262 Family history of osteoporosis: Secondary | ICD-10-CM | POA: Diagnosis not present

## 2015-06-30 DIAGNOSIS — F17210 Nicotine dependence, cigarettes, uncomplicated: Secondary | ICD-10-CM | POA: Diagnosis present

## 2015-06-30 DIAGNOSIS — Z8249 Family history of ischemic heart disease and other diseases of the circulatory system: Secondary | ICD-10-CM | POA: Diagnosis not present

## 2015-06-30 DIAGNOSIS — Z833 Family history of diabetes mellitus: Secondary | ICD-10-CM | POA: Diagnosis not present

## 2015-06-30 DIAGNOSIS — Z302 Encounter for sterilization: Secondary | ICD-10-CM | POA: Diagnosis not present

## 2015-06-30 DIAGNOSIS — O328XX0 Maternal care for other malpresentation of fetus, not applicable or unspecified: Principal | ICD-10-CM | POA: Diagnosis present

## 2015-06-30 DIAGNOSIS — O321XX0 Maternal care for breech presentation, not applicable or unspecified: Secondary | ICD-10-CM | POA: Diagnosis present

## 2015-06-30 LAB — RPR: RPR Ser Ql: NONREACTIVE

## 2015-06-30 SURGERY — Surgical Case
Anesthesia: Spinal | Laterality: Bilateral | Wound class: Clean Contaminated

## 2015-06-30 SURGERY — Surgical Case
Anesthesia: Spinal | Laterality: Bilateral

## 2015-06-30 MED ORDER — DEXAMETHASONE SODIUM PHOSPHATE 4 MG/ML IJ SOLN
INTRAMUSCULAR | Status: DC | PRN
  Administered 2015-06-30: 4 mg via INTRAVENOUS

## 2015-06-30 MED ORDER — SIMETHICONE 80 MG PO CHEW
80.0000 mg | CHEWABLE_TABLET | ORAL | Status: DC
  Filled 2015-06-30 (×4): qty 1

## 2015-06-30 MED ORDER — PHENYLEPHRINE 8 MG IN D5W 100 ML (0.08MG/ML) PREMIX OPTIME
INJECTION | INTRAVENOUS | Status: DC | PRN
  Administered 2015-06-30: 60 ug/min via INTRAVENOUS

## 2015-06-30 MED ORDER — MORPHINE SULFATE (PF) 0.5 MG/ML IJ SOLN
INTRAMUSCULAR | Status: DC | PRN
  Administered 2015-06-30: .2 mg via INTRATHECAL

## 2015-06-30 MED ORDER — OXYCODONE HCL 5 MG PO TABS: 10.0000 mg | ORAL_TABLET | ORAL | Status: DC | PRN

## 2015-06-30 MED ORDER — PHENYLEPHRINE 8 MG IN D5W 100 ML (0.08MG/ML) PREMIX OPTIME
INJECTION | INTRAVENOUS | Status: AC
  Filled 2015-06-30: qty 100

## 2015-06-30 MED ORDER — PROCHLORPERAZINE EDISYLATE 5 MG/ML IJ SOLN: 10.0000 mg | Freq: Once | INTRAMUSCULAR | Status: DC | PRN

## 2015-06-30 MED ORDER — LACTATED RINGERS IV SOLN
INTRAVENOUS | Status: DC
  Administered 2015-06-30: 10:00:00 via INTRAVENOUS

## 2015-06-30 MED ORDER — ONDANSETRON HCL 4 MG/2ML IJ SOLN
INTRAMUSCULAR | Status: DC | PRN
  Administered 2015-06-30: 4 mg via INTRAVENOUS

## 2015-06-30 MED ORDER — FENTANYL CITRATE (PF) 100 MCG/2ML IJ SOLN
INTRAMUSCULAR | Status: AC
  Filled 2015-06-30: qty 2

## 2015-06-30 MED ORDER — EPHEDRINE 5 MG/ML INJ
INTRAVENOUS | Status: AC
  Filled 2015-06-30: qty 10

## 2015-06-30 MED ORDER — SOD CITRATE-CITRIC ACID 500-334 MG/5ML PO SOLN
30.0000 mL | Freq: Once | ORAL | Status: AC
  Administered 2015-06-30: 30 mL via ORAL
  Filled 2015-06-30: qty 15

## 2015-06-30 MED ORDER — MORPHINE SULFATE (PF) 0.5 MG/ML IJ SOLN
INTRAMUSCULAR | Status: AC
  Filled 2015-06-30: qty 10

## 2015-06-30 MED ORDER — FAMOTIDINE IN NACL 20-0.9 MG/50ML-% IV SOLN
20.0000 mg | Freq: Once | INTRAVENOUS | Status: AC
  Administered 2015-06-30: 20 mg via INTRAVENOUS
  Filled 2015-06-30: qty 50

## 2015-06-30 MED ORDER — ONDANSETRON HCL 4 MG/2ML IJ SOLN
INTRAMUSCULAR | Status: AC
  Filled 2015-06-30: qty 2

## 2015-06-30 MED ORDER — LACTATED RINGERS IV SOLN: Freq: Once | INTRAVENOUS | Status: DC

## 2015-06-30 MED ORDER — NALBUPHINE HCL 10 MG/ML IJ SOLN: 5.0000 mg | Freq: Once | INTRAMUSCULAR | Status: DC | PRN

## 2015-06-30 MED ORDER — IBUPROFEN 100 MG/5ML PO SUSP
600.0000 mg | Freq: Four times a day (QID) | ORAL | Status: DC
  Administered 2015-06-30 – 2015-07-02 (×8): 600 mg via ORAL
  Filled 2015-06-30 (×9): qty 30

## 2015-06-30 MED ORDER — MENTHOL 3 MG MT LOZG
1.0000 | LOZENGE | OROMUCOSAL | Status: DC | PRN
  Filled 2015-06-30: qty 9

## 2015-06-30 MED ORDER — LACTATED RINGERS IV SOLN
INTRAVENOUS | Status: DC
  Administered 2015-06-30 (×3): via INTRAVENOUS

## 2015-06-30 MED ORDER — PHENYLEPHRINE HCL 10 MG/ML IJ SOLN
INTRAMUSCULAR | Status: DC | PRN
  Administered 2015-06-30 (×2): 80 ug via INTRAVENOUS

## 2015-06-30 MED ORDER — WITCH HAZEL-GLYCERIN EX PADS: 1.0000 "application " | MEDICATED_PAD | CUTANEOUS | Status: DC | PRN

## 2015-06-30 MED ORDER — OXYTOCIN 10 UNIT/ML IJ SOLN
INTRAMUSCULAR | Status: AC
  Filled 2015-06-30: qty 4

## 2015-06-30 MED ORDER — CEFAZOLIN SODIUM-DEXTROSE 2-4 GM/100ML-% IV SOLN
2.0000 g | INTRAVENOUS | Status: AC
  Administered 2015-06-30: 2 g via INTRAVENOUS

## 2015-06-30 MED ORDER — LACTATED RINGERS IV SOLN
INTRAVENOUS | Status: DC
  Administered 2015-06-30 (×2): via INTRAVENOUS

## 2015-06-30 MED ORDER — SODIUM CHLORIDE 0.9 % IR SOLN
Status: DC | PRN
  Administered 2015-06-30: 1000 mL

## 2015-06-30 MED ORDER — LACTATED RINGERS IV SOLN
INTRAVENOUS | Status: DC
Start: 2015-06-30 — End: 2015-06-30

## 2015-06-30 MED ORDER — SIMETHICONE 80 MG PO CHEW
80.0000 mg | CHEWABLE_TABLET | Freq: Three times a day (TID) | ORAL | Status: DC
  Administered 2015-06-30 – 2015-07-02 (×4): 80 mg via ORAL
  Filled 2015-06-30 (×9): qty 1

## 2015-06-30 MED ORDER — PRENATAL MULTIVITAMIN CH
1.0000 | ORAL_TABLET | Freq: Every day | ORAL | Status: DC
  Administered 2015-07-01: 1 via ORAL
  Filled 2015-06-30 (×3): qty 1

## 2015-06-30 MED ORDER — DIPHENHYDRAMINE HCL 25 MG PO CAPS
25.0000 mg | ORAL_CAPSULE | Freq: Four times a day (QID) | ORAL | Status: DC | PRN
  Filled 2015-06-30: qty 1

## 2015-06-30 MED ORDER — NALOXONE HCL 0.4 MG/ML IJ SOLN: 0.4000 mg | INTRAMUSCULAR | Status: DC | PRN

## 2015-06-30 MED ORDER — NALBUPHINE HCL 10 MG/ML IJ SOLN: 5.0000 mg | INTRAMUSCULAR | Status: DC | PRN

## 2015-06-30 MED ORDER — NALOXONE HCL 2 MG/2ML IJ SOSY
1.0000 ug/kg/h | PREFILLED_SYRINGE | INTRAVENOUS | Status: DC | PRN
  Filled 2015-06-30: qty 2

## 2015-06-30 MED ORDER — MEPERIDINE HCL 25 MG/ML IJ SOLN: 6.2500 mg | INTRAMUSCULAR | Status: DC | PRN

## 2015-06-30 MED ORDER — OXYTOCIN 40 UNITS IN LACTATED RINGERS INFUSION - SIMPLE MED: 2.5000 [IU]/h | INTRAVENOUS | Status: AC

## 2015-06-30 MED ORDER — OXYCODONE HCL 5 MG PO TABS: 5.0000 mg | ORAL_TABLET | ORAL | Status: DC | PRN

## 2015-06-30 MED ORDER — KETOROLAC TROMETHAMINE 30 MG/ML IJ SOLN: 30.0000 mg | Freq: Four times a day (QID) | INTRAMUSCULAR | Status: DC | PRN

## 2015-06-30 MED ORDER — MEASLES, MUMPS & RUBELLA VAC ~~LOC~~ INJ
0.5000 mL | INJECTION | Freq: Once | SUBCUTANEOUS | Status: DC
  Filled 2015-06-30: qty 0.5

## 2015-06-30 MED ORDER — LACTATED RINGERS IV BOLUS (SEPSIS)
1000.0000 mL | Freq: Once | INTRAVENOUS | Status: AC
  Administered 2015-06-30: 1000 mL via INTRAVENOUS

## 2015-06-30 MED ORDER — SENNOSIDES-DOCUSATE SODIUM 8.6-50 MG PO TABS
2.0000 | ORAL_TABLET | ORAL | Status: DC
  Administered 2015-06-30 – 2015-07-01 (×2): 2 via ORAL
  Filled 2015-06-30 (×4): qty 2

## 2015-06-30 MED ORDER — ADENOSINE 6 MG/2ML IV SOLN
INTRAVENOUS | Status: AC
  Filled 2015-06-30: qty 2

## 2015-06-30 MED ORDER — PNEUMOCOCCAL VAC POLYVALENT 25 MCG/0.5ML IJ INJ
0.5000 mL | INJECTION | INTRAMUSCULAR | Status: DC
  Filled 2015-06-30: qty 0.5

## 2015-06-30 MED ORDER — SCOPOLAMINE 1 MG/3DAYS TD PT72
1.0000 | MEDICATED_PATCH | Freq: Once | TRANSDERMAL | Status: AC
  Administered 2015-06-30: 1 via TRANSDERMAL

## 2015-06-30 MED ORDER — ONDANSETRON HCL 4 MG/2ML IJ SOLN: 4.0000 mg | Freq: Three times a day (TID) | INTRAMUSCULAR | Status: DC | PRN

## 2015-06-30 MED ORDER — BUPIVACAINE IN DEXTROSE 0.75-8.25 % IT SOLN
INTRATHECAL | Status: DC | PRN
  Administered 2015-06-30: 1.6 mL via INTRATHECAL

## 2015-06-30 MED ORDER — FENTANYL CITRATE (PF) 100 MCG/2ML IJ SOLN
INTRAMUSCULAR | Status: DC | PRN
  Administered 2015-06-30: 20 ug via INTRATHECAL

## 2015-06-30 MED ORDER — IBUPROFEN 600 MG PO TABS
600.0000 mg | ORAL_TABLET | Freq: Four times a day (QID) | ORAL | Status: DC
  Filled 2015-06-30: qty 1

## 2015-06-30 MED ORDER — FENTANYL CITRATE (PF) 100 MCG/2ML IJ SOLN: 25.0000 ug | INTRAMUSCULAR | Status: DC | PRN

## 2015-06-30 MED ORDER — TETANUS-DIPHTH-ACELL PERTUSSIS 5-2.5-18.5 LF-MCG/0.5 IM SUSP
0.5000 mL | Freq: Once | INTRAMUSCULAR | Status: DC
  Filled 2015-06-30: qty 0.5

## 2015-06-30 MED ORDER — SODIUM CHLORIDE 0.9% FLUSH: 3.0000 mL | INTRAVENOUS | Status: DC | PRN

## 2015-06-30 MED ORDER — ZOLPIDEM TARTRATE 5 MG PO TABS: 5.0000 mg | ORAL_TABLET | Freq: Every evening | ORAL | Status: DC | PRN

## 2015-06-30 MED ORDER — SIMETHICONE 80 MG PO CHEW
80.0000 mg | CHEWABLE_TABLET | ORAL | Status: DC | PRN
  Administered 2015-07-01: 80 mg via ORAL
  Filled 2015-06-30: qty 1

## 2015-06-30 MED ORDER — COCONUT OIL OIL
1.0000 "application " | TOPICAL_OIL | Status: DC | PRN
  Filled 2015-06-30: qty 120

## 2015-06-30 MED ORDER — ACETAMINOPHEN 325 MG PO TABS
650.0000 mg | ORAL_TABLET | ORAL | Status: DC | PRN
  Administered 2015-07-01 (×2): 650 mg via ORAL
  Filled 2015-06-30 (×3): qty 2

## 2015-06-30 MED ORDER — DIBUCAINE 1 % RE OINT
1.0000 "application " | TOPICAL_OINTMENT | RECTAL | Status: DC | PRN
  Filled 2015-06-30: qty 28.4

## 2015-06-30 MED ORDER — ACETAMINOPHEN 500 MG PO TABS
1000.0000 mg | ORAL_TABLET | Freq: Four times a day (QID) | ORAL | Status: AC
Start: 2015-06-30 — End: 2015-07-01
  Administered 2015-06-30 (×2): 1000 mg via ORAL
  Filled 2015-06-30 (×3): qty 2

## 2015-06-30 SURGICAL SUPPLY — 38 items
APL SKNCLS STERI-STRIP NONHPOA (GAUZE/BANDAGES/DRESSINGS) ×2
BENZOIN TINCTURE PRP APPL 2/3 (GAUZE/BANDAGES/DRESSINGS) ×3 IMPLANT
CLAMP CORD UMBIL (MISCELLANEOUS) IMPLANT
CLOSURE STERI STRIP 1/2 X4 (GAUZE/BANDAGES/DRESSINGS) ×3 IMPLANT
CLOSURE WOUND 1/2 X4 (GAUZE/BANDAGES/DRESSINGS)
CLOTH BEACON ORANGE TIMEOUT ST (SAFETY) ×4 IMPLANT
CONTAINER PREFILL 10% NBF 15ML (MISCELLANEOUS) IMPLANT
DRSG OPSITE POSTOP 4X10 (GAUZE/BANDAGES/DRESSINGS) ×4 IMPLANT
DURAPREP 26ML APPLICATOR (WOUND CARE) ×4 IMPLANT
ELECT REM PT RETURN 9FT ADLT (ELECTROSURGICAL) ×4
ELECTRODE REM PT RTRN 9FT ADLT (ELECTROSURGICAL) ×2 IMPLANT
EXTRACTOR VACUUM M CUP 4 TUBE (SUCTIONS) IMPLANT
EXTRACTOR VACUUM M CUP 4' TUBE (SUCTIONS)
GLOVE BIO SURGEON STRL SZ8 (GLOVE) ×4 IMPLANT
GLOVE BIOGEL PI IND STRL 7.0 (GLOVE) ×2 IMPLANT
GLOVE BIOGEL PI INDICATOR 7.0 (GLOVE) ×2
GOWN STRL REUS W/TWL LRG LVL3 (GOWN DISPOSABLE) ×8 IMPLANT
KIT ABG SYR 3ML LUER SLIP (SYRINGE) ×4 IMPLANT
LIQUID BAND (GAUZE/BANDAGES/DRESSINGS) IMPLANT
NDL HYPO 25X5/8 SAFETYGLIDE (NEEDLE) ×1 IMPLANT
NEEDLE HYPO 25X5/8 SAFETYGLIDE (NEEDLE) ×4 IMPLANT
NS IRRIG 1000ML POUR BTL (IV SOLUTION) ×4 IMPLANT
PACK C SECTION WH (CUSTOM PROCEDURE TRAY) ×4 IMPLANT
PAD ABD 8X10 STRL (GAUZE/BANDAGES/DRESSINGS) ×3 IMPLANT
PAD OB MATERNITY 4.3X12.25 (PERSONAL CARE ITEMS) ×4 IMPLANT
PENCIL SMOKE EVAC W/HOLSTER (ELECTROSURGICAL) ×4 IMPLANT
SPONGE GAUZE 4X4 12PLY (GAUZE/BANDAGES/DRESSINGS) ×3 IMPLANT
STRIP CLOSURE SKIN 1/2X4 (GAUZE/BANDAGES/DRESSINGS) IMPLANT
SUT MNCRL 0 VIOLET CTX 36 (SUTURE) ×8 IMPLANT
SUT MONOCRYL 0 CTX 36 (SUTURE) ×8
SUT PDS AB 0 CTX 60 (SUTURE) ×4 IMPLANT
SUT PLAIN 0 NONE (SUTURE) ×6 IMPLANT
SUT PLAIN 2 0 (SUTURE)
SUT PLAIN 2 0 XLH (SUTURE) IMPLANT
SUT PLAIN ABS 2-0 CT1 27XMFL (SUTURE) IMPLANT
SUT VIC AB 4-0 KS 27 (SUTURE) ×4 IMPLANT
TOWEL OR 17X24 6PK STRL BLUE (TOWEL DISPOSABLE) ×4 IMPLANT
TRAY FOLEY CATH SILVER 14FR (SET/KITS/TRAYS/PACK) ×4 IMPLANT

## 2015-06-30 NOTE — Anesthesia Procedure Notes (Signed)
Spinal Patient location during procedure: OB Staffing Anesthesiologist: Franne Grip Preanesthetic Checklist Completed: patient identified, site marked, surgical consent, pre-op evaluation, timeout performed, IV checked, risks and benefits discussed and monitors and equipment checked Spinal Block Patient position: sitting Prep: DuraPrep Patient monitoring: heart rate, cardiac monitor, continuous pulse ox and blood pressure Approach: midline Location: L4-5 Injection technique: single-shot Needle Needle type: Pencan  Needle gauge: 24 G Needle length: 10 cm Additional Notes Tolerated well.

## 2015-06-30 NOTE — Brief Op Note (Signed)
06/30/2015  4:13 AM  PATIENT:  Faith Jacobson  40 y.o. female  PRE-OPERATIVE DIAGNOSIS:  Breech, active labor  POST-OPERATIVE DIAGNOSIS:  Breech, active labor  PROCEDURE:  Procedure(s): CESAREAN SECTION WITH BILATERAL TUBAL LIGATION (Bilateral)  SURGEON:  Surgeon(s) and Role:    * Jonnie Kind, MD - Assisting    * Everlene Farrier, MD - Primary  PHYSICIAN ASSISTANT:   ASSISTANTS: none   ANESTHESIA:   spinal  EBL:  Total I/O In: 2000 [I.V.:2000] Out: 425 [Urine:125; Blood:300]  BLOOD ADMINISTERED:none  DRAINS: Urinary Catheter (Foley)   LOCAL MEDICATIONS USED:  NONE  SPECIMEN:  Source of Specimen:  placenta, bilateral fallopian tube segments  DISPOSITION OF SPECIMEN:  PATHOLOGY  COUNTS:  YES  TOURNIQUET:  * No tourniquets in log *  DICTATION: .Other Dictation: Dictation Number [id number]  PLAN OF CARE: Admit to inpatient   PATIENT DISPOSITION:  PACU - hemodynamically stable.   Delay start of Pharmacological VTE agent (>24hrs) due to surgical blood loss or risk of bleeding: not applicable

## 2015-06-30 NOTE — Anesthesia Preprocedure Evaluation (Signed)
Anesthesia Evaluation  Patient identified by MRN, date of birth, ID band Patient awake  General Assessment Comment:Breech presentation  Reviewed: Allergy & Precautions, NPO status , Patient's Chart, lab work & pertinent test results  Airway Mallampati: II  TM Distance: >3 FB Neck ROM: Full    Dental no notable dental hx.    Pulmonary Current Smoker,    Pulmonary exam normal breath sounds clear to auscultation       Cardiovascular negative cardio ROS Normal cardiovascular exam Rhythm:Regular Rate:Normal     Neuro/Psych negative neurological ROS  negative psych ROS   GI/Hepatic negative GI ROS, Neg liver ROS,   Endo/Other  negative endocrine ROS  Renal/GU negative Renal ROS  negative genitourinary   Musculoskeletal negative musculoskeletal ROS (+)   Abdominal   Peds negative pediatric ROS (+)  Hematology negative hematology ROS (+)   Anesthesia Other Findings   Reproductive/Obstetrics negative OB ROS                             Anesthesia Physical Anesthesia Plan  ASA: II  Anesthesia Plan: Spinal   Post-op Pain Management:    Induction: Intravenous  Airway Management Planned: Natural Airway  Additional Equipment:   Intra-op Plan:   Post-operative Plan:   Informed Consent: I have reviewed the patients History and Physical, chart, labs and discussed the procedure including the risks, benefits and alternatives for the proposed anesthesia with the patient or authorized representative who has indicated his/her understanding and acceptance.   Dental advisory given  Plan Discussed with: CRNA  Anesthesia Plan Comments: (Informed consent obtained prior to proceeding including risk of failure, 1% risk of PDPH, risk of minor discomfort and bruising.  Discussed rare but serious complications including epidural abscess, permanent nerve injury, epidural hematoma.  Discussed alternatives  to spinal analgesia and patient desires to proceed.  Timeout performed pre-procedure verifying patient name, procedure, and platelet count.   )        Anesthesia Quick Evaluation

## 2015-06-30 NOTE — Anesthesia Postprocedure Evaluation (Signed)
Anesthesia Post Note  Patient: Faith Jacobson  Procedure(s) Performed: Procedure(s) (LRB): CESAREAN SECTION WITH BILATERAL TUBAL LIGATION (Bilateral)  Patient location during evaluation: [facility] Anesthesia Type: Spinal Level of consciousness: awake and alert, oriented and awake Pain management: pain level controlled Vital Signs Assessment: post-procedure vital signs reviewed and stable Respiratory status: spontaneous breathing, nonlabored ventilation and respiratory function stable Cardiovascular status: stable Postop Assessment: adequate PO intake, no signs of nausea or vomiting, no headache, no backache and patient able to bend at knees Anesthetic complications: no     Last Vitals:  Filed Vitals:   06/30/15 1000 06/30/15 1300  BP: 88/40 101/51  Pulse: 56 79  Temp: 36.5 C 36.7 C  Resp: 18 18    Last Pain:  Filed Vitals:   06/30/15 1335  PainSc: 2    Pain Goal: Patients Stated Pain Goal: 1 (06/30/15 [id number])               Aliza Moret

## 2015-06-30 NOTE — Progress Notes (Signed)
Patients blood pressure at 10:00 was 88/40 with a pulse of 56.  Called Dr. Helane Rima at 10:20 and left a message.  Called again at 10:45 and spoke to Dr. Helane Rima.  Pt is not having any other symptoms at this time.  Dr. Helane Rima did not want any orders at this time.  Will monitor pt closely when getting out of bed for the first time.

## 2015-06-30 NOTE — MAU Note (Signed)
Pt presents complaining of SROM at 0200 with clear fluid and some bloody show. 1.5cm in office this Monday. Some contractions. Good fetal movement

## 2015-06-30 NOTE — Lactation Note (Addendum)
This note was copied from a baby's chart. Lactation Consultation Note  Baby 47 hours old and has been spitty. Mother can easily express colostrum and has been spoon feeding drops to baby. Spoon fed baby colostrum and allowed him to suckle finger to interest him in sucking but baby sleepy. Suggest mother call for help when baby cues. Mother has short shaft nipples.  Provided her w/ hand pump to prepump before latching. Reviewed basics including cluster feeding. Mom encouraged to feed baby 8-12 times/24 hours and with feeding cues.  Mom made aware of O/P services, breastfeeding support groups, community resources, and our phone # for post-discharge questions.     Patient Name: Faith Jacobson [id number] Date: 06/30/2015 Reason for consult: Initial assessment   Maternal Data Has patient been taught Hand Expression?: Yes Does the patient have breastfeeding experience prior to this delivery?: No  Feeding Feeding Type: Breast Fed Length of feed: 0 min (mom expressed few drops colostrum baby licked )  LATCH Score/Interventions Latch: Too sleepy or reluctant, no latch achieved, no sucking elicited. Intervention(s): Skin to skin;Teach feeding cues;Waking techniques  Audible Swallowing: None  Type of Nipple: Everted at rest and after stimulation  Comfort (Breast/Nipple): Soft / non-tender     Hold (Positioning): Assistance needed to correctly position infant at breast and maintain latch.  LATCH Score: 5  Lactation Tools Discussed/Used     Consult Status Consult Status: Follow-up Date: 07/01/15 Follow-up type: In-patient    Vivianne Master [facility] 06/30/2015, 11:32 AM

## 2015-06-30 NOTE — Progress Notes (Signed)
Subjective: Postpartum Day 1: Cesarean Delivery Patient reports tolerating PO.    Objective: Vital signs in last 24 hours: Temp:  [97.3 F (36.3 C)-98 F (36.7 C)] 97.9 F (36.6 C) (06/01 0635) Pulse Rate:  [79-100] 88 (06/01 0635) Resp:  [12-25] 18 (06/01 0635) BP: (73-107)/(57-66) 107/66 mmHg (06/01 0635) SpO2:  [90 %-100 %] 99 % (06/01 0635) Weight:  [180 lb (81.647 kg)] 180 lb (81.647 kg) (06/01 0350)  Physical Exam:  General: alert and cooperative Lochia: appropriate Uterine Fundus: firm Incision: abd dressing CDI DVT Evaluation: No evidence of DVT seen on physical exam. Negative Homan's sign. No cords or calf tenderness. No significant calf/ankle edema.   Recent Labs  06/29/15 1110  HGB 12.0  HCT 35.0*    Assessment/Plan: Status post Cesarean section. Doing well postoperatively.  Continue current care.  Tasnim Balentine G 06/30/2015, 7:58 AM

## 2015-06-30 NOTE — Op Note (Signed)
NAMEMarland Kitchen  Faith Jacobson, Faith Jacobson NO.:  [id number]  MEDICAL RECORD NO.:  [id number]  LOCATION:  [facility]                          FACILITY:  [facility]  PHYSICIAN:  Daleen Bo. Gaetano Net, M.D. DATE OF BIRTH:  02/27/1975  DATE OF PROCEDURE:  06/30/2015 DATE OF DISCHARGE:                              OPERATIVE REPORT   PREOPERATIVE DIAGNOSES: 1. Active labor. 2. Footling breech. 3. Desires permanent sterilization.  POSTOPERATIVE DIAGNOSES: 1. Active labor. 2. Footling breech. 3. Desires permanent sterilization.  PROCEDURES: 1. Primary low-transverse cesarean section. 2. Bilateral tubal ligation.  SURGEON:  Daleen Bo. Gaetano Net, M.D.  ASSISTANT:  Charlton Haws, M.D.  ANESTHESIA:  Spinal.  ESTIMATED BLOOD LOSS:  350 mL.  SPECIMENS:  Placenta and bilateral fallopian tube segments to Pathology.  FINDINGS:  Viable female infant.  Apgars, arterial cord pH and weight pending.  INDICATIONS AND CONSENT:  This patient is a 40 year old, G1, P0 at 74 and 2/7th weeks, who presents to MAU with approximately 3 hours of contractions.  Examination reveals the cervix to be 8 cm dilated with feet palpable through the membranes.  I am called at 3:15 a.m. and arrived at the hospital at 3:20 a.m.  While en route, I am told the patient is now 9 cm and she is directed back to the operating room and the faculty practice physician on-call, Dr. Glo Herring is also called to assist in starting the case.  When I arrived, the patient is sitting up on the OR table having her spinal placed.  Dr. Glo Herring was also in attendance.  After placing her spinal, she was placed in the dorsal supine position with a 15-degree left lateral wedge.  She was prepped emergently with Betadine and a Foley catheter was placed.  A time-out was undertaken.  She was draped in a sterile fashion.  After testing for adequate spinal anesthesia, skin was entered through a Pfannenstiel incision and dissection was carried out in layer to the  peritoneum, which was perforated and bluntly extended.  The vesicouterine peritoneum was then taken down.  The uterus was incised in a low-transverse manner and the uterine cavity was entered bluntly with a hemostat.  The uterine incision was extended cephalad laterally.  The feet were retrieved from the vagina and were then delivered through the incision.  The baby was then delivered from the double footling breech position without difficulty.  Cry and tone were noted.  Cord was clamped and cut and the baby was handed to awaiting Pediatrics team.  Placenta was manually delivered and the cavity was clean.  Uterus was closed in two running locking imbricating layers of 0 Monocryl suture, which achieved good hemostasis.  The patient was again asked if she wants tubal ligation. She states that she does.  The permanence and failure of the tubal are emphasized and she states she understands and wants the procedure done. The left fallopian tube was identified from cornu to fimbria.  Grasped the mid-ampullary portion with a Babcock clamp and a knuckle of fallopian tube was doubly ligated with two free ties of plain suture. The intervening knuckle of tube was sharply resected and cautery was used to assure hemostasis.  Similar procedure was carried out on the right fallopian  tube.  Both ovaries appeared normal.  Lavage was carried out and the cavity was clean.  The anterior peritoneum was then closed in running fashion with 0 Monocryl suture, which was also used to reapproximate the pyramidalis muscle in the midline.  Anterior rectus fascia was closed in running fashion with a 0 looped PDS.  The subcutaneous layer was closed with interrupted plain suture and the skin was closed in a subcuticular fashion with 4-0 Vicryl on a Keith needle. Benzoin and Steri-Strips, pressure dressing were applied.  All counts were correct.  The patient was taken to the recovery room in  stable condition.     Daleen Bo Gaetano Net, M.D.     JET/MEDQ  D:  06/30/2015  T:  06/30/2015  Job:  [id number]

## 2015-06-30 NOTE — H&P (Signed)
Faith Jacobson is a 40 y.o. female presenting for UCs startingabout 12:30 am per patient's sister. Maternal Medical History:  Reason for admission: Contractions.   Contractions: Onset was 3-5 hours ago.      OB History    Gravida Para Term Preterm AB TAB SAB Ectopic Multiple Living   1 1 1       0 1     Past Medical History  Diagnosis Date  . Cervical dysplasia   . Vaginal Pap smear, abnormal    Past Surgical History  Procedure Laterality Date  . Tonsillectomy and adenoidectomy    . Colposcopy     Family History: family history includes Diabetes in her maternal grandfather; Hypertension in her mother; Osteoporosis in her mother; Rheum arthritis in her mother. Social History:  reports that she has been smoking Cigarettes.  She has been smoking about 1.00 pack per day. She has never used smokeless tobacco. She reports that she does not drink alcohol or use illicit drugs.   Prenatal Transfer Tool  Maternal Diabetes: No Genetic Screening: Normal Maternal Ultrasounds/Referrals: Normal Fetal Ultrasounds or other Referrals:  None Maternal Substance Abuse:  No Significant Maternal Medications:  None Significant Maternal Lab Results:  None Other Comments:  None  ROS  Dilation: 9 Effacement (%): 90 Station: 0 Exam by:: Dr Loetta Rough Blood pressure 104/65, pulse 79, temperature 98 F (36.7 C), temperature source Oral, resp. rate 18, height 5\' 3"  (1.6 m), weight 180 lb (81.647 kg), unknown if currently breastfeeding. Exam Physical Exam  Cardiovascular: Normal rate.   Respiratory: Effort normal.    Prenatal labs: ABO, Rh: --/--/A POS, A POS (05/31 1110) Antibody: NEG (05/31 1110) Rubella: Nonimmune (11/04 0000) RPR: Non Reactive (05/31 1110)  HBsAg: Negative (11/04 0000)  HIV: Non-reactive (11/04 0000)  GBS:     Assessment/Plan: 40 yo G1P0 @ 39 2/7 weeks in active labor with advanced cervical dilation and footling breech Emergent cesarean section   Niang Mitcheltree II,Helaina Stefano  E 06/30/2015, 4:11 AM

## 2015-06-30 NOTE — Progress Notes (Signed)
Called to notify of Dr. Gaetano Net request that he start the c/s on pt because of advancing cervical dilation. MD to meet pt in OR

## 2015-06-30 NOTE — Transfer of Care (Signed)
Immediate Anesthesia Transfer of Care Note  Patient: Faith Jacobson  Procedure(s) Performed: Procedure(s) with comments: CESAREAN SECTION WITH BILATERAL TUBAL LIGATION (Bilateral) - Needs RNFA Maida Sale   Patient Location: PACU  Anesthesia Type:Spinal  Level of Consciousness: awake, alert , oriented and patient cooperative  Airway & Oxygen Therapy: Patient Spontanous Breathing  Post-op Assessment: Report given to RN and Post -op Vital signs reviewed and stable  Post vital signs: Reviewed and stable  Last Vitals:  TEMP BP 80/59, 99/57 HR 93, 87 RR 19 POX 100  Last Pain:  0       Complications: No apparent anesthesia complications

## 2015-06-30 NOTE — Progress Notes (Signed)
Called back regarding pt cervical dilation now 9cm and fetal station low in vagina. Asked if MD wanted Dr. Glo Herring to start the c/s and MD replied yes. Will meet Dr. Glo Herring in [place]

## 2015-06-30 NOTE — Progress Notes (Signed)
Notified of pt arrival in MAU, exam of 8cm with footling breech presentation. MD to MAU

## 2015-07-01 ENCOUNTER — Encounter (HOSPITAL_COMMUNITY): Payer: Self-pay | Admitting: Obstetrics and Gynecology

## 2015-07-01 LAB — CBC
HEMATOCRIT: 27.3 % — AB (ref 36.0–46.0)
Hemoglobin: 9.3 g/dL — ABNORMAL LOW (ref 12.0–15.0)
MCH: 31.4 pg (ref 26.0–34.0)
MCHC: 34.1 g/dL (ref 30.0–36.0)
MCV: 92.2 fL (ref 78.0–100.0)
Platelets: 215 10*3/uL (ref 150–400)
RBC: 2.96 MIL/uL — ABNORMAL LOW (ref 3.87–5.11)
RDW: 14.3 % (ref 11.5–15.5)
WBC: 15.9 10*3/uL — ABNORMAL HIGH (ref 4.0–10.5)

## 2015-07-01 NOTE — Progress Notes (Signed)
Subjective: Postpartum Day 2: Cesarean Delivery Patient reports tolerating PO, + flatus and no problems voiding.    Objective: Vital signs in last 24 hours: Temp:  [97.7 F (36.5 C)-98.7 F (37.1 C)] 98.7 F (37.1 C) (06/02 0555) Pulse Rate:  [56-87] 87 (06/02 0555) Resp:  [18] 18 (06/02 0555) BP: (82-112)/(40-58) 107/51 mmHg (06/02 0555) SpO2:  [96 %-98 %] 96 % (06/01 1430)  Physical Exam:  General: alert and cooperative Lochia: appropriate Uterine Fundus: firm Incision: healing well, abd dressing removed and small old drainage noted on honeycomb dressing which was removed and replaced. DVT Evaluation: No evidence of DVT seen on physical exam. Negative Homan's sign. No cords or calf tenderness. No significant calf/ankle edema.   Recent Labs  06/29/15 1110 07/01/15 0503  HGB 12.0 9.3*  HCT 35.0* 27.3*    Assessment/Plan: Status post Cesarean section. Doing well postoperatively.  Desires circ.  CURTIS,CAROL G 07/01/2015, 7:53 AM

## 2015-07-01 NOTE — Lactation Note (Signed)
This note was copied from a baby's chart. Lactation Consultation Note Baby hadn't been on the breast d/t wouldn't latch. Baby wouldn't even suckle on gloved finger for RN or LC with much stimulation. Mom has pendulum breast w/semi flat nipple, that everts slightly w/stimulation. Is slightly compressible but baby isn't able to pull nipple in for deep latch and keep it there. Fitted mom w/#20, #16. #20 fits well after DEBP done. Mom shown how to use DEBP & how to disassemble, clean, & reassemble parts.Mom knows to pump q3h for 15-20 min. Encouraged mom STS before BF to stimulate baby to BF. Mom can hand express some colostrum, has been giving it to baby in a spoon. Explained to mom baby needs to be latching by now and needs much stimulation. W/gloved finger, baby wouldn't suck or even show cues of huger. Mom BF baby w/#20 NS to Lt. Nipple. Lt. Nipple has split from nipple piercing tear over 10 yrs ago per pt. Rt. Nipple has a little tear. Mom very attentive to baby. After fitting NS latched baby onto breast and BF. Encouraged mom to use "C" hold on breast. Mom has a little colostrum that smeared on flange after pumping, offered to supplement w/formula in syring, mom stated she preferred not to. Reported to RN and [employer] of status. Patient Name: Faith Jacobson [id number] Date: 07/01/2015 Reason for consult: Follow-up assessment;Infant weight loss;Difficult latch   Maternal Data    Feeding Feeding Type: Breast Fed Length of feed: 10 min (still BF)  LATCH Score/Interventions Latch: Repeated attempts needed to sustain latch, nipple held in mouth throughout feeding, stimulation needed to elicit sucking reflex. Intervention(s): Skin to skin;Teach feeding cues;Waking techniques Intervention(s): Adjust position;Assist with latch;Breast massage;Breast compression  Audible Swallowing: A few with stimulation Intervention(s): Skin to skin;Hand expression Intervention(s): Alternate breast  massage  Type of Nipple: Everted at rest and after stimulation Intervention(s): Shells;Hand pump;Double electric pump  Comfort (Breast/Nipple): Soft / non-tender     Hold (Positioning): Full assist, staff holds infant at breast Intervention(s): Breastfeeding basics reviewed;Support Pillows;Position options;Skin to skin  LATCH Score: 6  Lactation Tools Discussed/Used Tools: Shells;Nipple [place] Fuel;Pump Nipple shield size: 16;20 Shell Type: Inverted Breast pump type: Double-Electric Breast Pump Pump Review: Setup, frequency, and cleaning;Milk Storage Initiated by:: Allayne Stack RN Date initiated:: 07/01/15   Consult Status Consult Status: Follow-up Date: 07/01/15 Follow-up type: In-patient    Faith Jacobson, Faith Jacobson 07/01/2015, 7:08 AM

## 2015-07-02 MED ORDER — IBUPROFEN 100 MG/5ML PO SUSP: 600.0000 mg | Freq: Four times a day (QID) | ORAL | Status: DC

## 2015-07-02 MED ORDER — OXYCODONE HCL 10 MG PO TABS: 10.0000 mg | ORAL_TABLET | ORAL | Status: DC | PRN

## 2015-07-02 NOTE — Discharge Summary (Signed)
Obstetric Discharge Summary Reason for Admission: onset of labor Prenatal Procedures: none Intrapartum Procedures: cesarean: low cervical, transverse Postpartum Procedures: none Complications-Operative and Postpartum: none HEMOGLOBIN  Date Value Ref Range Status  07/01/2015 9.3* 12.0 - 15.0 g/dL Final    Comment:    REPEATED TO VERIFY DELTA CHECK NOTED    HCT  Date Value Ref Range Status  07/01/2015 27.3* 36.0 - 46.0 % Final    Physical Exam:  General: alert, cooperative, appears stated age and no distress Lochia: appropriate Uterine Fundus: firm Incision: healing well DVT Evaluation: No evidence of DVT seen on physical exam.  Discharge Diagnoses: Term Pregnancy-delivered  Discharge Information: Date: 07/02/2015 Activity: pelvic rest Diet: routine Medications: Ibuprofen and Percocet Condition: stable Instructions: refer to practice specific booklet Discharge to: home   Newborn Data: Live born female  Birth Weight: 7 lb 2.3 oz (3240 g) APGAR: 8, 9  Home with mother.  Quinlan Mcfall C 07/02/2015, 7:26 AM

## 2015-09-13 ENCOUNTER — Telehealth: Payer: Self-pay

## 2015-09-13 ENCOUNTER — Telehealth (HOSPITAL_COMMUNITY): Payer: Self-pay | Admitting: Lactation Services

## 2015-09-13 NOTE — Telephone Encounter (Signed)
Patient called stating she had been receiving OB care at [facility] and she delivered 2 mos ago. She has been bleeding for 10 weeks since her c/s and BTL.  She said that recently they diagnosed her with retained placenta.  They have given her Rx for metherligine (?sp?).   She said she wants to talk with you and get you opinion. She cannot wait to come back to you.  SHe wants to see if you think this med ok. Also, Ok with breasfeeding? Or if she should just go on and have D&C.    She wants your opinions on this.

## 2015-09-13 NOTE — Telephone Encounter (Signed)
Telephone call, reviewed best to try short-term medication Methergine for 3 days if bleeding does not stop will have the ultrasound repeated at [facility] if retained placenta still present will then proceed to [facility].

## 2015-09-13 NOTE — Telephone Encounter (Signed)
Mother prescribed Methergine L2 for retained placenta and wanted to know if it was safe for breastfeeding.  Reported that it is L2 probably compatible - per Clent Jacks short term ( 1 week) low dose regimen do not apparently pose problems in nursing infants/mothers.  Let mother know mother's with retained placenta can experience low milk supply and if she notices dip in supply w/ medication she may want to post pump a few times a day with DEBP to boost her milk suppy.

## 2015-09-15 ENCOUNTER — Telehealth (HOSPITAL_COMMUNITY): Payer: Self-pay | Admitting: Lactation Services

## 2015-09-15 NOTE — Telephone Encounter (Signed)
Mom wanting to know about supplementing baby due to low breast milk supply as a result of retained placental fragments. Enc mom to put baby to breast with cues, then supplement with EBM/formula until satisfied, and then post-pump. Discussed EBM storage guidelines. Enc mom to continue pumping to protect milk supply and to know if/when her breast milk supply increases.

## 2015-09-21 ENCOUNTER — Encounter (HOSPITAL_COMMUNITY): Payer: Self-pay

## 2015-09-21 NOTE — H&P (Addendum)
Faith Jacobson is an 40 y.o. female s/p c-section with persistent vaginal bleeding.  No improvement despite methergine.  US shows retained POC.  No f/c.    Menstrual History: Patient's last menstrual period was 06/30/2015.    Past Medical History:  Diagnosis Date  . Cervical dysplasia   . GERD (gastroesophageal reflux disease)   . HSV infection   . PONV (postoperative nausea and vomiting)   . Vaginal Pap smear, abnormal     Past Surgical History:  Procedure Laterality Date  . CESAREAN SECTION WITH BILATERAL TUBAL LIGATION Bilateral 06/30/2015   Procedure: CESAREAN SECTION WITH BILATERAL TUBAL LIGATION;  Surgeon: Everlene Farrier, MD;  Location: [facility];  Service: Obstetrics;  Laterality: Bilateral;  . COLPOSCOPY    . TONSILLECTOMY AND ADENOIDECTOMY      Family History  Problem Relation Age of Onset  . Diabetes Maternal Grandfather   . Hypertension Mother   . Rheum arthritis Mother   . Osteoporosis Mother     Social History:  reports that she has been smoking Cigarettes.  She has been smoking about 1.00 pack per day. She has never used smokeless tobacco. She reports that she drinks about 1.5 oz of alcohol per week . She reports that she does not use drugs.  Allergies: No Known Allergies  No prescriptions prior to admission.    ROS  Height 5' 3.5" (1.613 m), weight 150 lb (68 kg), last menstrual period 06/30/2015, unknown if currently breastfeeding. Physical Exam  Gen - NAD CV - RRR Lungs - clear Abd - soft, NT/ND Ext - NT  [place]:  3cm hyperechoic mass in the uterus  Assessment/Plan:  Retained POC Ultrasound guided D&C R/b/a discussed, questions answered, informed consent  Karan Ramnauth 09/21/2015, 2:03 PM

## 2015-09-22 MED ORDER — DEXTROSE 5 % IV SOLN
2.0000 g | INTRAVENOUS | Status: AC
  Administered 2015-09-23: 2 g via INTRAVENOUS
  Filled 2015-09-22: qty 2

## 2015-09-23 ENCOUNTER — Encounter (HOSPITAL_COMMUNITY)
Admission: RE | Disposition: A | Discharge status: Home / Self Care | Payer: Self-pay | Source: Ambulatory Visit | Attending: Obstetrics and Gynecology

## 2015-09-23 ENCOUNTER — Ambulatory Visit (HOSPITAL_COMMUNITY): Payer: BLUE CROSS/BLUE SHIELD | Admitting: Anesthesiology

## 2015-09-23 ENCOUNTER — Encounter (HOSPITAL_COMMUNITY): Payer: Self-pay | Admitting: *Deleted

## 2015-09-23 ENCOUNTER — Ambulatory Visit (HOSPITAL_COMMUNITY): Payer: BLUE CROSS/BLUE SHIELD

## 2015-09-23 ENCOUNTER — Ambulatory Visit (HOSPITAL_COMMUNITY)
Admission: RE | Admit: 2015-09-23 | Discharge: 2015-09-23 | Disposition: A | Discharge status: Home / Self Care | Payer: BLUE CROSS/BLUE SHIELD | Source: Ambulatory Visit | Attending: Obstetrics and Gynecology | Admitting: Obstetrics and Gynecology

## 2015-09-23 DIAGNOSIS — K219 Gastro-esophageal reflux disease without esophagitis: Secondary | ICD-10-CM | POA: Diagnosis not present

## 2015-09-23 DIAGNOSIS — O99335 Smoking (tobacco) complicating the puerperium: Secondary | ICD-10-CM | POA: Diagnosis not present

## 2015-09-23 DIAGNOSIS — F17210 Nicotine dependence, cigarettes, uncomplicated: Secondary | ICD-10-CM | POA: Insufficient documentation

## 2015-09-23 DIAGNOSIS — O9963 Diseases of the digestive system complicating the puerperium: Secondary | ICD-10-CM | POA: Insufficient documentation

## 2015-09-23 DIAGNOSIS — R58 Hemorrhage, not elsewhere classified: Secondary | ICD-10-CM

## 2015-09-23 HISTORY — DX: Other specified postprocedural states: Z98.890

## 2015-09-23 HISTORY — DX: Herpesviral infection, unspecified: B00.9

## 2015-09-23 HISTORY — DX: Gastro-esophageal reflux disease without esophagitis: K21.9

## 2015-09-23 HISTORY — PX: HYSTEROSCOPY: SHX211

## 2015-09-23 HISTORY — PX: DILATION AND CURETTAGE OF UTERUS: SHX78

## 2015-09-23 HISTORY — DX: Nausea with vomiting, unspecified: R11.2

## 2015-09-23 LAB — CBC
HCT: 41 % (ref 36.0–46.0)
Hemoglobin: 14 g/dL (ref 12.0–15.0)
MCH: 30.7 pg (ref 26.0–34.0)
MCHC: 34.1 g/dL (ref 30.0–36.0)
MCV: 89.9 fL (ref 78.0–100.0)
PLATELETS: 261 10*3/uL (ref 150–400)
RBC: 4.56 MIL/uL (ref 3.87–5.11)
RDW: 12.9 % (ref 11.5–15.5)
WBC: 9.1 10*3/uL (ref 4.0–10.5)

## 2015-09-23 LAB — TYPE AND SCREEN
ABO/RH(D): A POS
ANTIBODY SCREEN: NEGATIVE

## 2015-09-23 SURGERY — DILATION AND CURETTAGE
Anesthesia: General | Site: Uterus

## 2015-09-23 MED ORDER — DEXAMETHASONE SODIUM PHOSPHATE 4 MG/ML IJ SOLN
INTRAMUSCULAR | Status: AC
  Filled 2015-09-23: qty 1

## 2015-09-23 MED ORDER — PROPOFOL 10 MG/ML IV BOLUS
INTRAVENOUS | Status: DC | PRN
  Administered 2015-09-23: 170 mg via INTRAVENOUS

## 2015-09-23 MED ORDER — KETOROLAC TROMETHAMINE 30 MG/ML IJ SOLN
INTRAMUSCULAR | Status: AC
  Filled 2015-09-23: qty 1

## 2015-09-23 MED ORDER — LIDOCAINE HCL (CARDIAC) 20 MG/ML IV SOLN
INTRAVENOUS | Status: AC
  Filled 2015-09-23: qty 5

## 2015-09-23 MED ORDER — LACTATED RINGERS IV SOLN: INTRAVENOUS | Status: DC

## 2015-09-23 MED ORDER — PROPOFOL 10 MG/ML IV BOLUS
INTRAVENOUS | Status: AC
  Filled 2015-09-23: qty 20

## 2015-09-23 MED ORDER — BUPIVACAINE HCL (PF) 0.5 % IJ SOLN
INTRAMUSCULAR | Status: AC
Start: 2015-09-23 — End: 2015-09-23
  Filled 2015-09-23: qty 30

## 2015-09-23 MED ORDER — SCOPOLAMINE 1 MG/3DAYS TD PT72
MEDICATED_PATCH | TRANSDERMAL | Status: AC
  Administered 2015-09-23: 1.5 mg via TRANSDERMAL
  Filled 2015-09-23: qty 1

## 2015-09-23 MED ORDER — ONDANSETRON HCL 4 MG/2ML IJ SOLN
INTRAMUSCULAR | Status: DC | PRN
  Administered 2015-09-23: 4 mg via INTRAVENOUS

## 2015-09-23 MED ORDER — METOCLOPRAMIDE HCL 5 MG/ML IJ SOLN: 10.0000 mg | Freq: Once | INTRAMUSCULAR | Status: DC | PRN

## 2015-09-23 MED ORDER — SCOPOLAMINE 1 MG/3DAYS TD PT72
1.0000 | MEDICATED_PATCH | Freq: Once | TRANSDERMAL | Status: DC
  Administered 2015-09-23: 1.5 mg via TRANSDERMAL

## 2015-09-23 MED ORDER — ONDANSETRON HCL 4 MG/2ML IJ SOLN
INTRAMUSCULAR | Status: AC
  Filled 2015-09-23: qty 2

## 2015-09-23 MED ORDER — MEPERIDINE HCL 25 MG/ML IJ SOLN: 6.2500 mg | INTRAMUSCULAR | Status: DC | PRN

## 2015-09-23 MED ORDER — FENTANYL CITRATE (PF) 100 MCG/2ML IJ SOLN: 25.0000 ug | INTRAMUSCULAR | Status: DC | PRN

## 2015-09-23 MED ORDER — FENTANYL CITRATE (PF) 100 MCG/2ML IJ SOLN
INTRAMUSCULAR | Status: DC | PRN
  Administered 2015-09-23 (×2): 50 ug via INTRAVENOUS

## 2015-09-23 MED ORDER — FENTANYL CITRATE (PF) 100 MCG/2ML IJ SOLN
INTRAMUSCULAR | Status: AC
  Filled 2015-09-23: qty 2

## 2015-09-23 MED ORDER — KETOROLAC TROMETHAMINE 30 MG/ML IJ SOLN
INTRAMUSCULAR | Status: DC | PRN
  Administered 2015-09-23: 30 mg via INTRAVENOUS

## 2015-09-23 MED ORDER — DEXAMETHASONE SODIUM PHOSPHATE 10 MG/ML IJ SOLN
INTRAMUSCULAR | Status: DC | PRN
  Administered 2015-09-23: 4 mg via INTRAVENOUS

## 2015-09-23 MED ORDER — MIDAZOLAM HCL 2 MG/2ML IJ SOLN
INTRAMUSCULAR | Status: AC
  Filled 2015-09-23: qty 2

## 2015-09-23 MED ORDER — LIDOCAINE HCL (CARDIAC) 20 MG/ML IV SOLN
INTRAVENOUS | Status: DC | PRN
  Administered 2015-09-23: 70 mg via INTRAVENOUS

## 2015-09-23 MED ORDER — MIDAZOLAM HCL 2 MG/2ML IJ SOLN
INTRAMUSCULAR | Status: DC | PRN
  Administered 2015-09-23: 2 mg via INTRAVENOUS

## 2015-09-23 MED ORDER — LACTATED RINGERS IV SOLN
INTRAVENOUS | Status: DC
  Administered 2015-09-23 (×2): via INTRAVENOUS

## 2015-09-23 SURGICAL SUPPLY — 17 items
CATH ROBINSON RED A/P 16FR (CATHETERS) ×4 IMPLANT
CLOTH BEACON ORANGE TIMEOUT ST (SAFETY) ×4 IMPLANT
CONTAINER PREFILL 10% NBF 60ML (FORM) ×8 IMPLANT
GLOVE BIO SURGEON STRL SZ 6.5 (GLOVE) ×3 IMPLANT
GLOVE BIO SURGEONS STRL SZ 6.5 (GLOVE) ×1
GLOVE BIOGEL PI IND STRL 7.0 (GLOVE) ×4 IMPLANT
GLOVE BIOGEL PI INDICATOR 7.0 (GLOVE) ×4
GOWN STRL REUS W/TWL LRG LVL3 (GOWN DISPOSABLE) ×8 IMPLANT
PACK VAGINAL MINOR WOMEN LF (CUSTOM PROCEDURE TRAY) ×4 IMPLANT
PAD OB MATERNITY 4.3X12.25 (PERSONAL CARE ITEMS) ×4 IMPLANT
PAD PREP 24X48 CUFFED NSTRL (MISCELLANEOUS) ×4 IMPLANT
SET BERKELEY SUCTION TUBING (SUCTIONS) ×2 IMPLANT
SET TUBING HYSTEROSCOPY 2 NDL (TUBING) ×2 IMPLANT
TOWEL OR 17X24 6PK STRL BLUE (TOWEL DISPOSABLE) ×8 IMPLANT
TUBING HYDROFLEX HYSTEROSCOPY (TUBING) ×2 IMPLANT
VACURETTE 7MM CVD STRL WRAP (CANNULA) ×2 IMPLANT
WATER STERILE IRR 1000ML POUR (IV SOLUTION) ×4 IMPLANT

## 2015-09-23 NOTE — Transfer of Care (Signed)
Immediate Anesthesia Transfer of Care Note  Patient: Faith Jacobson  Procedure(s) Performed: Procedure(s) with comments: DILATATION AND CURETTAGE WITH ULTRASOUND GUIDANCE (N/A) - WITH ULTRASOUND GUIDANCE HYSTEROSCOPY (N/A)  Patient Location: PACU  Anesthesia Type:General  Level of Consciousness: awake, alert , oriented and patient cooperative  Airway & Oxygen Therapy: Patient Spontanous Breathing and Patient connected to nasal cannula oxygen  Post-op Assessment: Report given to RN and Post -op Vital signs reviewed and stable  Post vital signs: Reviewed and stable  Last Vitals:  Vitals:   09/23/15 1317  BP: 110/75  Pulse: 91  Resp: 18  Temp: 36.9 C    Last Pain:  Vitals:   09/23/15 1317  TempSrc: Oral      Patients Stated Pain Goal: 3 ([id number])  Complications: No apparent anesthesia complications

## 2015-09-23 NOTE — Anesthesia Postprocedure Evaluation (Signed)
Anesthesia Post Note  Patient: Larika Tiedeman  Procedure(s) Performed: Procedure(s) (LRB): DILATATION AND CURETTAGE WITH ULTRASOUND GUIDANCE (N/A) HYSTEROSCOPY (N/A)  Patient location during evaluation: PACU Anesthesia Type: General Level of consciousness: awake and alert Pain management: pain level controlled Vital Signs Assessment: post-procedure vital signs reviewed and stable Respiratory status: spontaneous breathing, nonlabored ventilation, respiratory function stable and patient connected to nasal cannula oxygen Cardiovascular status: blood pressure returned to baseline and stable Postop Assessment: no signs of nausea or vomiting Anesthetic complications: no     Last Vitals:  Vitals:   09/23/15 1433 09/23/15 1445  BP: 97/65 105/71  Pulse: 70 70  Resp: 11 19  Temp: 36.3 C     Last Pain:  Vitals:   09/23/15 1317  TempSrc: Oral   Pain Goal: Patients Stated Pain Goal: 0 (09/23/15 1433)               Montez Hageman

## 2015-09-23 NOTE — Discharge Instructions (Signed)
°  Post Anesthesia Home Care Instructions ° °Activity: °Get plenty of rest for the remainder of the day. A responsible adult should stay with you for 24 hours following the procedure.  °For the next 24 hours, DO NOT: °-Drive a car °-Operate machinery °-Drink alcoholic beverages °-Take any medication unless instructed by your physician °-Make any legal decisions or sign important papers. ° °Meals: °Start with liquid foods such as gelatin or soup. Progress to regular foods as tolerated. Avoid greasy, spicy, heavy foods. If nausea and/or vomiting occur, drink only clear liquids until the nausea and/or vomiting subsides. Call your physician if vomiting continues. ° °Special Instructions/Symptoms: °Your throat may feel dry or sore from the anesthesia or the breathing tube placed in your throat during surgery. If this causes discomfort, gargle with warm salt water. The discomfort should disappear within 24 hours. ° °If you had a scopolamine patch placed behind your ear for the management of post- operative nausea and/or vomiting: ° °1. The medication in the patch is effective for 72 hours, after which it should be removed.  Wrap patch in a tissue and discard in the trash. Wash hands thoroughly with soap and water. °2. You may remove the patch earlier than 72 hours if you experience unpleasant side effects which may include dry mouth, dizziness or visual disturbances. °3. Avoid touching the patch. Wash your hands with soap and water after contact with the patch. °  °DISCHARGE INSTRUCTIONS: D&C / D&E °The following instructions have been prepared to help you care for yourself upon your return home. °  °Personal hygiene: °• Use sanitary pads for vaginal drainage, not tampons. °• Shower the day after your procedure. °• NO tub baths, pools or Jacuzzis for 2-3 weeks. °• Wipe front to back after using the bathroom. ° °Activity and limitations: °• Do NOT drive or operate any equipment for 24 hours. The effects of anesthesia are  still present and drowsiness may result. °• Do NOT rest in bed all day. °• Walking is encouraged. °• Walk up and down stairs slowly. °• You may resume your normal activity in one to two days or as indicated by your physician. ° °Sexual activity: NO intercourse for at least 2 weeks after the procedure, or as indicated by your physician. ° °Diet: Eat a light meal as desired this evening. You may resume your usual diet tomorrow. ° °Return to work: You may resume your work activities in one to two days or as indicated by your doctor. ° °What to expect after your surgery: Expect to have vaginal bleeding/discharge for 2-3 days and spotting for up to 10 days. It is not unusual to have soreness for up to 1-2 weeks. You may have a slight burning sensation when you urinate for the first day. Mild cramps may continue for a couple of days. You may have a regular period in 2-6 weeks. ° °Call your doctor for any of the following: °• Excessive vaginal bleeding, saturating and changing one pad every hour. °• Inability to urinate 6 hours after discharge from hospital. °• Pain not relieved by pain medication. °• Fever of 100.4° F or greater. °• Unusual vaginal discharge or odor. ° ° Call for an appointment:  ° ° °Patient’s signature: ______________________ ° °Nurse’s signature ________________________ ° °Support person's signature_______________________ ° ° ° °

## 2015-09-23 NOTE — Anesthesia Procedure Notes (Signed)
Procedure Name: LMA Insertion Date/Time: 09/23/2015 1:37 PM Performed by: Raenette Rover Pre-anesthesia Checklist: Patient identified, Emergency Drugs available, Suction available and Patient being monitored Patient Re-evaluated:Patient Re-evaluated prior to inductionOxygen Delivery Method: Circle system utilized Preoxygenation: Pre-oxygenation with 100% oxygen Intubation Type: IV induction LMA: LMA inserted LMA Size: 4.0 Number of attempts: 1 Placement Confirmation: positive ETCO2,  CO2 detector and breath sounds checked- equal and bilateral Tube secured with: Tape Dental Injury: Teeth and Oropharynx as per pre-operative assessment

## 2015-09-23 NOTE — Anesthesia Preprocedure Evaluation (Signed)
Anesthesia Evaluation  Patient identified by MRN, date of birth, ID band Patient awake    Reviewed: Allergy & Precautions, NPO status , Patient's Chart, lab work & pertinent test results  History of Anesthesia Complications (+) PONV  Airway Mallampati: II  TM Distance: >3 FB Neck ROM: Full    Dental no notable dental hx.    Pulmonary Current Smoker,    Pulmonary exam normal breath sounds clear to auscultation       Cardiovascular negative cardio ROS Normal cardiovascular exam Rhythm:Regular Rate:Normal     Neuro/Psych negative neurological ROS  negative psych ROS   GI/Hepatic negative GI ROS, Neg liver ROS,   Endo/Other  negative endocrine ROS  Renal/GU negative Renal ROS  negative genitourinary   Musculoskeletal negative musculoskeletal ROS (+)   Abdominal   Peds negative pediatric ROS (+)  Hematology negative hematology ROS (+)   Anesthesia Other Findings   Reproductive/Obstetrics negative OB ROS                             Anesthesia Physical Anesthesia Plan  ASA: II  Anesthesia Plan: General   Post-op Pain Management:    Induction: Intravenous  Airway Management Planned: LMA  Additional Equipment:   Intra-op Plan:   Post-operative Plan: Extubation in OR  Informed Consent: I have reviewed the patients History and Physical, chart, labs and discussed the procedure including the risks, benefits and alternatives for the proposed anesthesia with the patient or authorized representative who has indicated his/her understanding and acceptance.   Dental advisory given  Plan Discussed with: CRNA  Anesthesia Plan Comments:         Anesthesia Quick Evaluation

## 2015-09-24 NOTE — Op Note (Signed)
NAME:  Faith Jacobson, Faith Jacobson NO.:  [id number]  MEDICAL RECORD NO.:  [id number]  LOCATION:  [facility]                          FACILITY:  [facility]  PHYSICIAN:  Marylynn Pearson, MD    DATE OF BIRTH:  09/25/1975  DATE OF PROCEDURE:  09/23/2015 DATE OF DISCHARGE:  09/23/2015                              OPERATIVE REPORT   PREOPERATIVE DIAGNOSIS:  Irregular vaginal bleeding, suspect retained products of conception.  POSTOPERATIVE DIAGNOSIS:  Irregular vaginal bleeding, suspect retained products of conception.  PROCEDURE:  Hysteroscopy dilatation and curettage.  SURGEON:  Marylynn Pearson, MD.  ANESTHESIA:  General.  COMPLICATIONS:  None.  SPECIMEN:  Endometrial curettings.  DESCRIPTION OF PROCEDURE:  The patient was taken to the operating room. After informed consent was obtained, she was given anesthesia, placed in the dorsal lithotomy position using Allen stirrups.  In and out catheter was used to drain her bladder.  Bivalve speculum was placed in the vagina.  Single-tooth tenaculum was attached to the anterior lip of the cervix.  Ultrasound was placed on the abdomen and an irregular endometrial cavity was noted and hyperechoic lesion was noted near the right fundus.  Suction curette was inserted; however, no change to the hyperechoic lesion was seen on ultrasound.  A gentle curetting was then performed throughout the endometrial cavity.  Small amount of tissue was removed.  Placed on Telfa to be passed off to be sent to pathology. Ultrasound was placed back and the hyperechoic lesion appeared smaller, but still present; however, we were unable to determine if it was within the endometrium or the myometrium.  I have then elected to proceed with hysteroscopy to better characterize this hyperechoic lesion. Hysteroscope was then inserted into the uterine cavity.  There were no lesions, fibroids, polyps, or masses of tissue noted in the endometrial cavity.  Bilateral tubal  ostia were identified and appeared normal.  A repeat curette was performed.  No more tissue was obtained.  All instruments were removed from the vagina.  The patient was extubated and taken to the recovery room in stable condition.  Sponge, lap, needle, and instrument counts were correct x2.     Marylynn Pearson, MD    GA/MEDQ  D:  09/23/2015  T:  09/24/2015  Job:  [id number]

## 2015-09-27 ENCOUNTER — Encounter (HOSPITAL_COMMUNITY): Payer: Self-pay | Admitting: Obstetrics and Gynecology

## 2016-02-10 ENCOUNTER — Telehealth: Payer: Self-pay | Admitting: *Deleted

## 2016-02-10 MED ORDER — VALACYCLOVIR HCL 500 MG PO TABS: ORAL_TABLET | ORAL | 1 refills | Status: DC

## 2016-02-10 NOTE — Telephone Encounter (Signed)
Pt has annual scheduled on July 2018 requesting refill on valtrex 500 mg takes PRN, had baby last year and no refill. Please advise

## 2016-02-10 NOTE — Telephone Encounter (Signed)
Ok for refill Valtrex 500mg  twice daily for 3-5 days  #30

## 2016-02-10 NOTE — Telephone Encounter (Signed)
Left message that Rx has been sent.

## 2016-08-08 ENCOUNTER — Encounter: Payer: BLUE CROSS/BLUE SHIELD | Admitting: Women's Health

## 2016-08-15 ENCOUNTER — Ambulatory Visit (INDEPENDENT_AMBULATORY_CARE_PROVIDER_SITE_OTHER): Payer: BLUE CROSS/BLUE SHIELD | Admitting: Women's Health

## 2016-08-15 ENCOUNTER — Encounter: Payer: Self-pay | Admitting: Women's Health

## 2016-08-15 VITALS — BP 128/84 | Ht 62.5 in | Wt 145.0 lb

## 2016-08-15 DIAGNOSIS — Z1151 Encounter for screening for human papillomavirus (HPV): Secondary | ICD-10-CM

## 2016-08-15 DIAGNOSIS — Z113 Encounter for screening for infections with a predominantly sexual mode of transmission: Secondary | ICD-10-CM | POA: Diagnosis not present

## 2016-08-15 DIAGNOSIS — Z01419 Encounter for gynecological examination (general) (routine) without abnormal findings: Secondary | ICD-10-CM

## 2016-08-15 NOTE — Progress Notes (Signed)
Faith Jacobson Line 05/09/1975 [id number]    History:    Presents for annual exam.  Delivered via C-section 14 months ago healthy son, D&C after C-section.Marland Kitchen BTL. History of LGSIL 2006, 2011. HSV rare outbreaks. Has not had a screening mammogram. Smokes less than a pack daily. Gained 50 pounds with pregnancy is within 15 pounds of prepregnant weight. Partner.  Past medical history, past surgical history, family history and social history were all reviewed and documented in the EPIC chart. Works at [employer]. Son thriving, father not involve currently in prison. Mother, siblings history of alcohol abuse and addiction.  ROS:  A ROS was performed and pertinent positives and negatives are included.  Exam:  Vitals:   08/15/16 0919  BP: 128/84  Weight: 145 lb (65.8 kg)  Height: 5' 2.5" (1.588 m)   Body mass index is 26.1 kg/m.   General appearance:  Normal Thyroid:  Symmetrical, normal in size, without palpable masses or nodularity. Respiratory  Auscultation:  Clear without wheezing or rhonchi Cardiovascular  Auscultation:  Regular rate, without rubs, murmurs or gallops  Edema/varicosities:  Not grossly evident Abdominal  Soft,nontender, without masses, guarding or rebound.  Liver/spleen:  No organomegaly noted  Hernia:  None appreciated  Skin  Inspection:  Grossly normal   Breasts: Examined lying and sitting.     Right: Without masses, retractions, discharge or axillary adenopathy.     Left: Without masses, retractions, discharge or axillary adenopathy. Gentitourinary   Inguinal/mons:  Normal without inguinal adenopathy  External genitalia:  Normal  BUS/Urethra/Skene's glands:  Normal  Vagina:  Normal  Cervix:  Normal  Uterus:  normal in size, shape and contour.  Midline and mobile  Adnexa/parametria:     Rt: Without masses or tenderness.   Lt: Without masses or tenderness.  Anus and perineum: Normal  Digital rectal exam: Normal sphincter tone without palpated  masses or tenderness  Assessment/Plan:  41 y.o. S WF G1 P1 for annual exam with no complaints.  Monthly cycle/BTL STD screen Smoker 2006, 2011 LGSIL HSV  Plan: Long discussion on importance of decrease or no smoking. Aware of how help hazards. SBE's, annual screening mammogram, breast center information given instructed to schedule. Exercise, calcium rich diet, vitamin D 2000 daily encouraged. CBC, CMP, Pap with HR HPV typing, Valtrex 500 twice daily 3-5 days as needed prescription, proper use given and reviewed. GC/Chlamydia, HIV, hep B, C, RPR. Condoms encouraged until permanent partner.  [place], 10:54 AM 08/15/2016

## 2016-08-15 NOTE — Patient Instructions (Signed)

## 2016-08-16 LAB — GC/CHLAMYDIA PROBE AMP
CT PROBE, AMP APTIMA: NOT DETECTED
GC PROBE AMP APTIMA: NOT DETECTED

## 2016-08-16 LAB — PAP, TP IMAGING W/ HPV RNA, RFLX HPV TYPE 16,18/45: HPV MRNA, HIGH RISK: NOT DETECTED

## 2016-12-11 ENCOUNTER — Telehealth: Payer: Self-pay | Admitting: *Deleted

## 2016-12-11 MED ORDER — VALACYCLOVIR HCL 500 MG PO TABS: ORAL_TABLET | ORAL | 0 refills | Status: DC

## 2016-12-11 NOTE — Telephone Encounter (Signed)
Pt called requesting Rx for valtrex 500 mg tablet, per note on 08/15/16. Rx sent. 30 day supply send to local phramacy. Pt will call back for 90 day with refills with new mail order pharmacy.

## 2017-01-17 ENCOUNTER — Other Ambulatory Visit: Payer: Self-pay | Admitting: Women's Health

## 2017-01-17 DIAGNOSIS — Z1231 Encounter for screening mammogram for malignant neoplasm of breast: Secondary | ICD-10-CM

## 2017-02-14 ENCOUNTER — Ambulatory Visit
Admission: RE | Admit: 2017-02-14 | Discharge: 2017-02-14 | Disposition: A | Discharge status: Home / Self Care | Payer: BLUE CROSS/BLUE SHIELD | Source: Ambulatory Visit | Attending: Women's Health | Admitting: Women's Health

## 2017-02-14 DIAGNOSIS — Z1231 Encounter for screening mammogram for malignant neoplasm of breast: Secondary | ICD-10-CM

## 2017-02-15 ENCOUNTER — Encounter: Payer: Self-pay | Admitting: Women's Health

## 2017-04-16 DIAGNOSIS — D1800 Hemangioma unspecified site: Secondary | ICD-10-CM | POA: Diagnosis not present

## 2017-04-16 DIAGNOSIS — D225 Melanocytic nevi of trunk: Secondary | ICD-10-CM | POA: Diagnosis not present

## 2017-04-16 DIAGNOSIS — L814 Other melanin hyperpigmentation: Secondary | ICD-10-CM | POA: Diagnosis not present

## 2017-04-16 DIAGNOSIS — L821 Other seborrheic keratosis: Secondary | ICD-10-CM | POA: Diagnosis not present

## 2017-05-03 ENCOUNTER — Telehealth: Payer: Self-pay | Admitting: *Deleted

## 2017-05-03 MED ORDER — VALACYCLOVIR HCL 500 MG PO TABS: ORAL_TABLET | ORAL | 0 refills | Status: DC

## 2017-05-03 NOTE — Telephone Encounter (Signed)
Pt called requesting refill on Valtrex 500 mg tablet, Rx sent, pt aware.

## 2017-07-06 DIAGNOSIS — R112 Nausea with vomiting, unspecified: Secondary | ICD-10-CM | POA: Diagnosis not present

## 2017-07-06 DIAGNOSIS — Q394 Esophageal web: Secondary | ICD-10-CM | POA: Diagnosis not present

## 2017-07-06 DIAGNOSIS — T18120A Food in esophagus causing compression of trachea, initial encounter: Secondary | ICD-10-CM | POA: Diagnosis not present

## 2017-07-06 DIAGNOSIS — T18128A Food in esophagus causing other injury, initial encounter: Secondary | ICD-10-CM | POA: Diagnosis not present

## 2017-07-06 DIAGNOSIS — R0989 Other specified symptoms and signs involving the circulatory and respiratory systems: Secondary | ICD-10-CM | POA: Diagnosis not present

## 2017-07-06 DIAGNOSIS — R1310 Dysphagia, unspecified: Secondary | ICD-10-CM | POA: Diagnosis not present

## 2017-07-06 DIAGNOSIS — K222 Esophageal obstruction: Secondary | ICD-10-CM | POA: Diagnosis not present

## 2017-07-06 DIAGNOSIS — G8911 Acute pain due to trauma: Secondary | ICD-10-CM | POA: Diagnosis not present

## 2017-07-06 DIAGNOSIS — F17200 Nicotine dependence, unspecified, uncomplicated: Secondary | ICD-10-CM | POA: Diagnosis not present

## 2017-07-06 DIAGNOSIS — X58XXXA Exposure to other specified factors, initial encounter: Secondary | ICD-10-CM | POA: Diagnosis not present

## 2017-07-07 DIAGNOSIS — R1310 Dysphagia, unspecified: Secondary | ICD-10-CM | POA: Diagnosis not present

## 2017-07-07 DIAGNOSIS — T18120A Food in esophagus causing compression of trachea, initial encounter: Secondary | ICD-10-CM | POA: Diagnosis not present

## 2017-07-07 DIAGNOSIS — R1319 Other dysphagia: Secondary | ICD-10-CM | POA: Insufficient documentation

## 2017-07-07 DIAGNOSIS — Q394 Esophageal web: Secondary | ICD-10-CM | POA: Insufficient documentation

## 2017-07-07 DIAGNOSIS — K222 Esophageal obstruction: Secondary | ICD-10-CM | POA: Diagnosis not present

## 2017-07-07 DIAGNOSIS — R0989 Other specified symptoms and signs involving the circulatory and respiratory systems: Secondary | ICD-10-CM | POA: Diagnosis not present

## 2017-07-07 DIAGNOSIS — R112 Nausea with vomiting, unspecified: Secondary | ICD-10-CM | POA: Diagnosis not present

## 2017-07-22 DIAGNOSIS — R1310 Dysphagia, unspecified: Secondary | ICD-10-CM | POA: Diagnosis not present

## 2017-07-22 DIAGNOSIS — K209 Esophagitis, unspecified: Secondary | ICD-10-CM | POA: Diagnosis not present

## 2017-07-22 DIAGNOSIS — K222 Esophageal obstruction: Secondary | ICD-10-CM | POA: Diagnosis not present

## 2017-08-19 ENCOUNTER — Encounter: Payer: BLUE CROSS/BLUE SHIELD | Admitting: Women's Health

## 2017-08-19 DIAGNOSIS — Z9889 Other specified postprocedural states: Secondary | ICD-10-CM | POA: Diagnosis not present

## 2017-08-19 DIAGNOSIS — K209 Esophagitis, unspecified: Secondary | ICD-10-CM | POA: Diagnosis not present

## 2017-08-19 DIAGNOSIS — Z8719 Personal history of other diseases of the digestive system: Secondary | ICD-10-CM | POA: Diagnosis not present

## 2017-08-19 DIAGNOSIS — F17200 Nicotine dependence, unspecified, uncomplicated: Secondary | ICD-10-CM | POA: Diagnosis not present

## 2017-08-19 DIAGNOSIS — K222 Esophageal obstruction: Secondary | ICD-10-CM | POA: Diagnosis not present

## 2017-08-19 DIAGNOSIS — Z79899 Other long term (current) drug therapy: Secondary | ICD-10-CM | POA: Diagnosis not present

## 2017-08-19 DIAGNOSIS — R1310 Dysphagia, unspecified: Secondary | ICD-10-CM | POA: Diagnosis not present

## 2017-09-17 DIAGNOSIS — K222 Esophageal obstruction: Secondary | ICD-10-CM | POA: Diagnosis not present

## 2017-09-17 DIAGNOSIS — R1310 Dysphagia, unspecified: Secondary | ICD-10-CM | POA: Diagnosis not present

## 2017-10-22 ENCOUNTER — Encounter: Payer: BLUE CROSS/BLUE SHIELD | Admitting: Women's Health

## 2017-10-24 DIAGNOSIS — M9901 Segmental and somatic dysfunction of cervical region: Secondary | ICD-10-CM | POA: Diagnosis not present

## 2017-10-24 DIAGNOSIS — M7542 Impingement syndrome of left shoulder: Secondary | ICD-10-CM | POA: Diagnosis not present

## 2017-10-24 DIAGNOSIS — M9902 Segmental and somatic dysfunction of thoracic region: Secondary | ICD-10-CM | POA: Diagnosis not present

## 2017-10-24 DIAGNOSIS — M9907 Segmental and somatic dysfunction of upper extremity: Secondary | ICD-10-CM | POA: Diagnosis not present

## 2017-10-29 ENCOUNTER — Encounter: Payer: Self-pay | Admitting: Women's Health

## 2017-10-29 ENCOUNTER — Ambulatory Visit: Payer: BLUE CROSS/BLUE SHIELD | Admitting: Women's Health

## 2017-10-29 VITALS — BP 122/80 | Ht 62.0 in | Wt 153.0 lb

## 2017-10-29 DIAGNOSIS — M7542 Impingement syndrome of left shoulder: Secondary | ICD-10-CM | POA: Diagnosis not present

## 2017-10-29 DIAGNOSIS — Z13220 Encounter for screening for lipoid disorders: Secondary | ICD-10-CM | POA: Diagnosis not present

## 2017-10-29 DIAGNOSIS — M9907 Segmental and somatic dysfunction of upper extremity: Secondary | ICD-10-CM | POA: Diagnosis not present

## 2017-10-29 DIAGNOSIS — M9902 Segmental and somatic dysfunction of thoracic region: Secondary | ICD-10-CM | POA: Diagnosis not present

## 2017-10-29 DIAGNOSIS — M9901 Segmental and somatic dysfunction of cervical region: Secondary | ICD-10-CM | POA: Diagnosis not present

## 2017-10-29 DIAGNOSIS — Z01419 Encounter for gynecological examination (general) (routine) without abnormal findings: Secondary | ICD-10-CM

## 2017-10-29 LAB — LIPID PANEL
CHOL/HDL RATIO: 3.8 (calc) (ref <5.0)
CHOLESTEROL: 180 mg/dL (ref <200)
HDL: 48 mg/dL — ABNORMAL LOW (ref >50)
LDL CHOLESTEROL (CALC): 104 mg/dL — AB
Non-HDL Cholesterol (Calc): 132 mg/dL (calc) — ABNORMAL HIGH (ref <130)
TRIGLYCERIDES: 158 mg/dL — AB (ref <150)

## 2017-10-29 LAB — CBC WITH DIFFERENTIAL/PLATELET
BASOS ABS: 80 {cells}/uL (ref 0–200)
BASOS PCT: 0.8 %
EOS ABS: 320 {cells}/uL (ref 15–500)
EOS PCT: 3.2 %
HEMATOCRIT: 43.7 % (ref 35.0–45.0)
HEMOGLOBIN: 14.7 g/dL (ref 11.7–15.5)
LYMPHS ABS: 2150 {cells}/uL (ref 850–3900)
MCH: 30.4 pg (ref 27.0–33.0)
MCHC: 33.6 g/dL (ref 32.0–36.0)
MCV: 90.5 fL (ref 80.0–100.0)
MPV: 11.2 fL (ref 7.5–12.5)
Monocytes Relative: 7.2 %
NEUTROS ABS: 6730 {cells}/uL (ref 1500–7800)
Neutrophils Relative %: 67.3 %
Platelets: 299 10*3/uL (ref 140–400)
RBC: 4.83 10*6/uL (ref 3.80–5.10)
RDW: 12.6 % (ref 11.0–15.0)
Total Lymphocyte: 21.5 %
WBC mixed population: 720 cells/uL (ref 200–950)
WBC: 10 10*3/uL (ref 3.8–10.8)

## 2017-10-29 LAB — GLUCOSE, RANDOM: Glucose, Bld: 74 mg/dL (ref 65–99)

## 2017-10-29 MED ORDER — VALACYCLOVIR HCL 500 MG PO TABS: ORAL_TABLET | ORAL | 6 refills | Status: DC

## 2017-10-29 MED ORDER — VALACYCLOVIR HCL 500 MG PO TABS: ORAL_TABLET | ORAL | 3 refills | Status: DC

## 2017-10-29 NOTE — Progress Notes (Signed)
Faith Jacobson 1975/02/25 [id number]    History:    Presents for annual exam.  Regular monthly cycle/BTL/same partner, denies need for STD screen.  HSV rare outbreaks.  Normal Pap and mammogram history.  Smokes less than 1 pack daily.  Past medical history, past surgical history, family history and social history were all reviewed and documented in the EPIC chart.  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  Exercises most days of the week, son is 2-1/2 thriving.  His father is not involved with care.  ROS:  A ROS was performed and pertinent positives and negatives are included.  Exam:  Vitals:   10/29/17 1105  BP: 122/80  Weight: 153 lb (69.4 kg)  Height: 5\' 2"  (1.575 m)   Body mass index is 27.98 kg/m.   General appearance:  Normal Thyroid:  Symmetrical, normal in size, without palpable masses or nodularity. Respiratory  Auscultation:  Clear without wheezing or rhonchi Cardiovascular  Auscultation:  Regular rate, without rubs, murmurs or gallops  Edema/varicosities:  Not grossly evident Abdominal  Soft,nontender, without masses, guarding or rebound.  Liver/spleen:  No organomegaly noted  Hernia:  None appreciated  Skin  Inspection:  Grossly normal   Breasts: Examined lying and sitting.     Right: Without masses, retractions, discharge or axillary adenopathy.     Left: Without masses, retractions, discharge or axillary adenopathy. Gentitourinary   Inguinal/mons:  Normal without inguinal adenopathy  External genitalia:  Normal  BUS/Urethra/Skene's glands:  Normal  Vagina:  Normal  Cervix:  Normal  Uterus: normal in size, shape and contour.  Midline and mobile  Adnexa/parametria:     Rt: Without masses or tenderness.   Lt: Without masses or tenderness.  Anus and perineum: Normal  Digital rectal exam: Normal sphincter tone without palpated masses or tenderness  Assessment/Plan:  42 y.o. S WF G1, P1 for annual exam no complaints.  Monthly cycle/BTL HSV rare  outbreaks Smoker  Plan: Valtrex 500 twice daily for 3 to 5 days as needed prescription, proper use given and reviewed.  SBE's, continue annual 3D screening mammogram.  Reviewed importance of decreasing/quitting smoking.  Tips to quit reviewed.  CBC, glucose, lipid panel, Pap normal 2018, new screening guidelines reviewed.    [place], 12:56 PM 10/29/2017

## 2017-10-29 NOTE — Patient Instructions (Signed)
Coping with Quitting Smoking Quitting smoking is a physical and mental challenge. You will face cravings, withdrawal symptoms, and temptation. Before quitting, work with your health care provider to make a plan that can help you cope. Preparation can help you quit and keep you from giving in. How can I cope with cravings? Cravings usually last for 5-10 minutes. If you get through it, the craving will pass. Consider taking the following actions to help you cope with cravings:  Keep your mouth busy: ? Chew sugar-free gum. ? Suck on hard candies or a straw. ? Brush your teeth.  Keep your hands and body busy: ? Immediately change to a different activity when you feel a craving. ? Squeeze or play with a ball. ? Do an activity or a hobby, like making bead jewelry, practicing needlepoint, or working with wood. ? Mix up your normal routine. ? Take a short exercise break. Go for a quick walk or run up and down stairs. ? Spend time in public places where smoking is not allowed.  Focus on doing something kind or helpful for someone else.  Call a friend or family member to talk during a craving.  Join a support group.  Call a quit line, such as 1-800-QUIT-NOW.  Talk with your health care provider about medicines that might help you cope with cravings and make quitting easier for you.  How can I deal with withdrawal symptoms? Your body may experience negative effects as it tries to get used to not having nicotine in the system. These effects are called withdrawal symptoms. They may include:  Feeling hungrier than normal.  Trouble concentrating.  Irritability.  Trouble sleeping.  Feeling depressed.  Restlessness and agitation.  Craving a cigarette.  To manage withdrawal symptoms:  Avoid places, people, and activities that trigger your cravings.  Remember why you want to quit.  Get plenty of sleep.  Avoid coffee and other caffeinated drinks. These may worsen some of your  symptoms.  How can I handle social situations? Social situations can be difficult when you are quitting smoking, especially in the first few weeks. To manage this, you can:  Avoid parties, bars, and other social situations where people might be smoking.  Avoid alcohol.  Leave right away if you have the urge to smoke.  Explain to your family and friends that you are quitting smoking. Ask for understanding and support.  Plan activities with friends or family where smoking is not an option.  What are some ways I can cope with stress? Wanting to smoke may cause stress, and stress can make you want to smoke. Find ways to manage your stress. Relaxation techniques can help. For example:  Breathe slowly and deeply, in through your nose and out through your mouth.  Listen to soothing, relaxing music.  Talk with a family member or friend about your stress.  Light a candle.  Soak in a bath or take a shower.  Think about a peaceful place.  What are some ways I can prevent weight gain? Be aware that many people gain weight after they quit smoking. However, not everyone does. To keep from gaining weight, have a plan in place before you quit and stick to the plan after you quit. Your plan should include:  Having healthy snacks. When you have a craving, it may help to: ? Eat plain popcorn, crunchy carrots, celery, or other cut vegetables. ? Chew sugar-free gum.  Changing how you eat: ? Eat small portion sizes at meals. ?  Eat 4-6 small meals throughout the day instead of 1-2 large meals a day. ? Be mindful when you eat. Do not watch television or do other things that might distract you as you eat.  Exercising regularly: ? Make time to exercise each day. If you do not have time for a long workout, do short bouts of exercise for 5-10 minutes several times a day. ? Do some form of strengthening exercise, like weight lifting, and some form of aerobic exercise, like running or  swimming.  Drinking plenty of water or other low-calorie or no-calorie drinks. Drink 6-8 glasses of water daily, or as much as instructed by your health care provider.  Summary  Quitting smoking is a physical and mental challenge. You will face cravings, withdrawal symptoms, and temptation to smoke again. Preparation can help you as you go through these challenges.  You can cope with cravings by keeping your mouth busy (such as by chewing gum), keeping your body and hands busy, and making calls to family, friends, or a helpline for people who want to quit smoking.  You can cope with withdrawal symptoms by avoiding places where people smoke, avoiding drinks with caffeine, and getting plenty of rest.  Ask your health care provider about the different ways to prevent weight gain, avoid stress, and handle social situations. This information is not intended to replace advice given to you by your health care provider. Make sure you discuss any questions you have with your health care provider. Document Released: 01/13/2016 Document Revised: 01/13/2016 Document Reviewed: 01/13/2016 Elsevier Interactive Patient Education  2018 [employer]. Carbohydrate Counting for Diabetes Mellitus, Adult Carbohydrate counting is a method for keeping track of how many carbohydrates you eat. Eating carbohydrates naturally increases the amount of sugar (glucose) in the blood. Counting how many carbohydrates you eat helps keep your blood glucose within normal limits, which helps you manage your diabetes (diabetes mellitus). It is important to know how many carbohydrates you can safely have in each meal. This is different for every person. A diet and nutrition specialist (registered dietitian) can help you make a meal plan and calculate how many carbohydrates you should have at each meal and snack. Carbohydrates are found in the following foods:  Grains, such as breads and cereals.  Dried beans and soy  products.  Starchy vegetables, such as potatoes, peas, and corn.  Fruit and fruit juices.  Milk and yogurt.  Sweets and snack foods, such as cake, cookies, candy, chips, and soft drinks.  How do I count carbohydrates? There are two ways to count carbohydrates in food. You can use either of the methods or a combination of both. Reading "Nutrition Facts" on packaged food The "Nutrition Facts" list is included on the labels of almost all packaged foods and beverages in the [place] It includes:  The serving size.  Information about nutrients in each serving, including the grams (g) of carbohydrate per serving.  To use the "Nutrition Facts":  Decide how many servings you will have.  Multiply the number of servings by the number of carbohydrates per serving.  The resulting number is the total amount of carbohydrates that you will be having.  Learning standard serving sizes of other foods When you eat foods containing carbohydrates that are not packaged or do not include "Nutrition Facts" on the label, you need to measure the servings in order to count the amount of carbohydrates:  Measure the foods that you will eat with a food scale or measuring cup, if  needed.  Decide how many standard-size servings you will eat.  Multiply the number of servings by 15. Most carbohydrate-rich foods have about 15 g of carbohydrates per serving. ? For example, if you eat 8 oz (170 g) of strawberries, you will have eaten 2 servings and 30 g of carbohydrates (2 servings x 15 g = 30 g).  For foods that have more than one food mixed, such as soups and casseroles, you must count the carbohydrates in each food that is included.  The following list contains standard serving sizes of common carbohydrate-rich foods. Each of these servings has about 15 g of carbohydrates:   hamburger bun or  English muffin.   oz (15 mL) syrup.   oz (14 g) jelly.  1 slice of bread.  1 six-inch tortilla.  3 oz (85 g)  cooked rice or pasta.  4 oz (113 g) cooked dried beans.  4 oz (113 g) starchy vegetable, such as peas, corn, or potatoes.  4 oz (113 g) hot cereal.  4 oz (113 g) mashed potatoes or  of a large baked potato.  4 oz (113 g) canned or frozen fruit.  4 oz (120 mL) fruit juice.  4-6 crackers.  6 chicken nuggets.  6 oz (170 g) unsweetened dry cereal.  6 oz (170 g) plain fat-free yogurt or yogurt sweetened with artificial sweeteners.  8 oz (240 mL) milk.  8 oz (170 g) fresh fruit or one small piece of fruit.  24 oz (680 g) popped popcorn.  Example of carbohydrate counting Sample meal  3 oz (85 g) chicken breast.  6 oz (170 g) brown rice.  4 oz (113 g) corn.  8 oz (240 mL) milk.  8 oz (170 g) strawberries with sugar-free whipped topping. Carbohydrate calculation 1. Identify the foods that contain carbohydrates: ? Rice. ? Corn. ? Milk. ? Strawberries. 2. Calculate how many servings you have of each food: ? 2 servings rice. ? 1 serving corn. ? 1 serving milk. ? 1 serving strawberries. 3. Multiply each number of servings by 15 g: ? 2 servings rice x 15 g = 30 g. ? 1 serving corn x 15 g = 15 g. ? 1 serving milk x 15 g = 15 g. ? 1 serving strawberries x 15 g = 15 g. 4. Add together all of the amounts to find the total grams of carbohydrates eaten: ? 30 g + 15 g + 15 g + 15 g = 75 g of carbohydrates total. This information is not intended to replace advice given to you by your health care provider. Make sure you discuss any questions you have with your health care provider. Document Released: 01/15/2005 Document Revised: 08/05/2015 Document Reviewed: 06/29/2015 Elsevier Interactive Patient Education  2018 [place] Maintenance, Female Adopting a healthy lifestyle and getting preventive care can go a long way to promote health and wellness. Talk with your health care provider about what schedule of regular examinations is right for you. This is a good chance  for you to check in with your provider about disease prevention and staying healthy. In between checkups, there are plenty of things you can do on your own. Experts have done a lot of research about which lifestyle changes and preventive measures are most likely to keep you healthy. Ask your health care provider for more information. Weight and diet Eat a healthy diet  Be sure to include plenty of vegetables, fruits, low-fat dairy products, and lean protein.  Do not eat  a lot of foods high in solid fats, added sugars, or salt.  Get regular exercise. This is one of the most important things you can do for your health. ? Most adults should exercise for at least 150 minutes each week. The exercise should increase your heart rate and make you sweat (moderate-intensity exercise). ? Most adults should also do strengthening exercises at least twice a week. This is in addition to the moderate-intensity exercise.  Maintain a healthy weight  Body mass index (BMI) is a measurement that can be used to identify possible weight problems. It estimates body fat based on height and weight. Your health care provider can help determine your BMI and help you achieve or maintain a healthy weight.  For females 42 years of age and older: ? A BMI below 18.5 is considered underweight. ? A BMI of 18.5 to 24.9 is normal. ? A BMI of 25 to 29.9 is considered overweight. ? A BMI of 30 and above is considered obese.  Watch levels of cholesterol and blood lipids  You should start having your blood tested for lipids and cholesterol at 42 years of age, then have this test every 5 years.  You may need to have your cholesterol levels checked more often if: ? Your lipid or cholesterol levels are high. ? You are older than 42 years of age. ? You are at high risk for heart disease.  Cancer screening Lung Cancer  Lung cancer screening is recommended for adults 67-60 years old who are at high risk for lung cancer because  of a history of smoking.  A yearly low-dose CT scan of the lungs is recommended for people who: ? Currently smoke. ? Have quit within the past 15 years. ? Have at least a 30-pack-year history of smoking. A pack year is smoking an average of one pack of cigarettes a day for 1 year.  Yearly screening should continue until it has been 15 years since you quit.  Yearly screening should stop if you develop a health problem that would prevent you from having lung cancer treatment.  Breast Cancer  Practice breast self-awareness. This means understanding how your breasts normally appear and feel.  It also means doing regular breast self-exams. Let your health care provider know about any changes, no matter how small.  If you are in your 20s or 30s, you should have a clinical breast exam (CBE) by a health care provider every 1-3 years as part of a regular health exam.  If you are 8 or older, have a CBE every year. Also consider having a breast X-ray (mammogram) every year.  If you have a family history of breast cancer, talk to your health care provider about genetic screening.  If you are at high risk for breast cancer, talk to your health care provider about having an MRI and a mammogram every year.  Breast cancer gene (BRCA) assessment is recommended for women who have family members with BRCA-related cancers. BRCA-related cancers include: ? Breast. ? Ovarian. ? Tubal. ? Peritoneal cancers.  Results of the assessment will determine the need for genetic counseling and BRCA1 and BRCA2 testing.  Cervical Cancer Your health care provider may recommend that you be screened regularly for cancer of the pelvic organs (ovaries, uterus, and vagina). This screening involves a pelvic examination, including checking for microscopic changes to the surface of your cervix (Pap test). You may be encouraged to have this screening done every 3 years, beginning at age 31.  For  women ages 74-65, health care  providers may recommend pelvic exams and Pap testing every 3 years, or they may recommend the Pap and pelvic exam, combined with testing for human papilloma virus (HPV), every 5 years. Some types of HPV increase your risk of cervical cancer. Testing for HPV may also be done on women of any age with unclear Pap test results.  Other health care providers may not recommend any screening for nonpregnant women who are considered low risk for pelvic cancer and who do not have symptoms. Ask your health care provider if a screening pelvic exam is right for you.  If you have had past treatment for cervical cancer or a condition that could lead to cancer, you need Pap tests and screening for cancer for at least 20 years after your treatment. If Pap tests have been discontinued, your risk factors (such as having a new sexual partner) need to be reassessed to determine if screening should resume. Some women have medical problems that increase the chance of getting cervical cancer. In these cases, your health care provider may recommend more frequent screening and Pap tests.  Colorectal Cancer  This type of cancer can be detected and often prevented.  Routine colorectal cancer screening usually begins at 42 years of age and continues through 42 years of age.  Your health care provider may recommend screening at an earlier age if you have risk factors for colon cancer.  Your health care provider may also recommend using home test kits to check for hidden blood in the stool.  A small camera at the end of a tube can be used to examine your colon directly (sigmoidoscopy or colonoscopy). This is done to check for the earliest forms of colorectal cancer.  Routine screening usually begins at age 72.  Direct examination of the colon should be repeated every 5-10 years through 42 years of age. However, you may need to be screened more often if early forms of precancerous polyps or small growths are found.  Skin  Cancer  Check your skin from head to toe regularly.  Tell your health care provider about any new moles or changes in moles, especially if there is a change in a mole's shape or color.  Also tell your health care provider if you have a mole that is larger than the size of a pencil eraser.  Always use sunscreen. Apply sunscreen liberally and repeatedly throughout the day.  Protect yourself by wearing long sleeves, pants, a wide-brimmed hat, and sunglasses whenever you are outside.  Heart disease, diabetes, and high blood pressure  High blood pressure causes heart disease and increases the risk of stroke. High blood pressure is more likely to develop in: ? People who have blood pressure in the high end of the normal range (130-139/85-89 mm Hg). ? People who are overweight or obese. ? People who are African American.  If you are 20-37 years of age, have your blood pressure checked every 3-5 years. If you are 19 years of age or older, have your blood pressure checked every year. You should have your blood pressure measured twice-once when you are at a hospital or clinic, and once when you are not at a hospital or clinic. Record the average of the two measurements. To check your blood pressure when you are not at a hospital or clinic, you can use: ? An automated blood pressure machine at a pharmacy. ? A home blood pressure monitor.  If you are between 55 years and  56 years old, ask your health care provider if you should take aspirin to prevent strokes.  Have regular diabetes screenings. This involves taking a blood sample to check your fasting blood sugar level. ? If you are at a normal weight and have a low risk for diabetes, have this test once every three years after 42 years of age. ? If you are overweight and have a high risk for diabetes, consider being tested at a younger age or more often. Preventing infection Hepatitis B  If you have a higher risk for hepatitis B, you should be  screened for this virus. You are considered at high risk for hepatitis B if: ? You were born in a country where hepatitis B is common. Ask your health care provider which countries are considered high risk. ? Your parents were born in a high-risk country, and you have not been immunized against hepatitis B (hepatitis B vaccine). ? You have HIV or AIDS. ? You use needles to inject street drugs. ? You live with someone who has hepatitis B. ? You have had sex with someone who has hepatitis B. ? You get hemodialysis treatment. ? You take certain medicines for conditions, including cancer, organ transplantation, and autoimmune conditions.  Hepatitis C  Blood testing is recommended for: ? Everyone born from [birth year] through 1965. ? Anyone with known risk factors for hepatitis C.  Sexually transmitted infections (STIs)  You should be screened for sexually transmitted infections (STIs) including gonorrhea and chlamydia if: ? You are sexually active and are younger than 42 years of age. ? You are older than 42 years of age and your health care provider tells you that you are at risk for this type of infection. ? Your sexual activity has changed since you were last screened and you are at an increased risk for chlamydia or gonorrhea. Ask your health care provider if you are at risk.  If you do not have HIV, but are at risk, it may be recommended that you take a prescription medicine daily to prevent HIV infection. This is called pre-exposure prophylaxis (PrEP). You are considered at risk if: ? You are sexually active and do not regularly use condoms or know the HIV status of your partner(s). ? You take drugs by injection. ? You are sexually active with a partner who has HIV.  Talk with your health care provider about whether you are at high risk of being infected with HIV. If you choose to begin PrEP, you should first be tested for HIV. You should then be tested every 3 months for as long as you are  taking PrEP. Pregnancy  If you are premenopausal and you may become pregnant, ask your health care provider about preconception counseling.  If you may become pregnant, take 400 to 800 micrograms (mcg) of folic acid every day.  If you want to prevent pregnancy, talk to your health care provider about birth control (contraception). Osteoporosis and menopause  Osteoporosis is a disease in which the bones lose minerals and strength with aging. This can result in serious bone fractures. Your risk for osteoporosis can be identified using a bone density scan.  If you are 66 years of age or older, or if you are at risk for osteoporosis and fractures, ask your health care provider if you should be screened.  Ask your health care provider whether you should take a calcium or vitamin D supplement to lower your risk for osteoporosis.  Menopause may have certain physical symptoms  and risks.  Hormone replacement therapy may reduce some of these symptoms and risks. Talk to your health care provider about whether hormone replacement therapy is right for you. Follow these instructions at home:  Schedule regular health, dental, and eye exams.  Stay current with your immunizations.  Do not use any tobacco products including cigarettes, chewing tobacco, or electronic cigarettes.  If you are pregnant, do not drink alcohol.  If you are breastfeeding, limit how much and how often you drink alcohol.  Limit alcohol intake to no more than 1 drink per day for nonpregnant women. One drink equals 12 ounces of beer, 5 ounces of wine, or 1 ounces of hard liquor.  Do not use street drugs.  Do not share needles.  Ask your health care provider for help if you need support or information about quitting drugs.  Tell your health care provider if you often feel depressed.  Tell your health care provider if you have ever been abused or do not feel safe at home. This information is not intended to replace  advice given to you by your health care provider. Make sure you discuss any questions you have with your health care provider. Document Released: 07/31/2010 Document Revised: 06/23/2015 Document Reviewed: 10/19/2014 Elsevier Interactive Patient Education  [employer].

## 2017-10-31 ENCOUNTER — Encounter: Payer: Self-pay | Admitting: *Deleted

## 2017-10-31 DIAGNOSIS — M7542 Impingement syndrome of left shoulder: Secondary | ICD-10-CM | POA: Diagnosis not present

## 2017-10-31 DIAGNOSIS — M9901 Segmental and somatic dysfunction of cervical region: Secondary | ICD-10-CM | POA: Diagnosis not present

## 2017-10-31 DIAGNOSIS — M9902 Segmental and somatic dysfunction of thoracic region: Secondary | ICD-10-CM | POA: Diagnosis not present

## 2017-10-31 DIAGNOSIS — M9907 Segmental and somatic dysfunction of upper extremity: Secondary | ICD-10-CM | POA: Diagnosis not present

## 2017-11-04 ENCOUNTER — Encounter: Payer: Self-pay | Admitting: Women's Health

## 2017-11-04 NOTE — Progress Notes (Signed)
Per pt mail Health Screening Form Form scanned in system as well

## 2017-11-07 DIAGNOSIS — M7542 Impingement syndrome of left shoulder: Secondary | ICD-10-CM | POA: Diagnosis not present

## 2017-11-07 DIAGNOSIS — M9907 Segmental and somatic dysfunction of upper extremity: Secondary | ICD-10-CM | POA: Diagnosis not present

## 2017-11-07 DIAGNOSIS — M9901 Segmental and somatic dysfunction of cervical region: Secondary | ICD-10-CM | POA: Diagnosis not present

## 2017-11-07 DIAGNOSIS — M9902 Segmental and somatic dysfunction of thoracic region: Secondary | ICD-10-CM | POA: Diagnosis not present

## 2017-12-12 DIAGNOSIS — R5383 Other fatigue: Secondary | ICD-10-CM | POA: Diagnosis not present

## 2017-12-12 DIAGNOSIS — F17200 Nicotine dependence, unspecified, uncomplicated: Secondary | ICD-10-CM | POA: Diagnosis not present

## 2017-12-12 DIAGNOSIS — R112 Nausea with vomiting, unspecified: Secondary | ICD-10-CM | POA: Diagnosis not present

## 2017-12-12 DIAGNOSIS — Z79899 Other long term (current) drug therapy: Secondary | ICD-10-CM | POA: Diagnosis not present

## 2017-12-12 DIAGNOSIS — R197 Diarrhea, unspecified: Secondary | ICD-10-CM | POA: Diagnosis not present

## 2018-04-03 ENCOUNTER — Encounter: Payer: Self-pay | Admitting: Podiatry

## 2018-04-03 ENCOUNTER — Ambulatory Visit: Payer: BLUE CROSS/BLUE SHIELD | Admitting: Podiatry

## 2018-04-03 ENCOUNTER — Ambulatory Visit (INDEPENDENT_AMBULATORY_CARE_PROVIDER_SITE_OTHER): Payer: BLUE CROSS/BLUE SHIELD

## 2018-04-03 DIAGNOSIS — M2042 Other hammer toe(s) (acquired), left foot: Secondary | ICD-10-CM

## 2018-04-03 DIAGNOSIS — M2011 Hallux valgus (acquired), right foot: Secondary | ICD-10-CM | POA: Diagnosis not present

## 2018-04-03 DIAGNOSIS — G5762 Lesion of plantar nerve, left lower limb: Secondary | ICD-10-CM

## 2018-04-03 DIAGNOSIS — D3610 Benign neoplasm of peripheral nerves and autonomic nervous system, unspecified: Secondary | ICD-10-CM

## 2018-04-03 MED ORDER — TRIAMCINOLONE ACETONIDE 10 MG/ML IJ SUSP
10.0000 mg | Freq: Once | INTRAMUSCULAR | Status: AC
  Administered 2018-04-03: 10 mg

## 2018-04-03 MED ORDER — MELOXICAM 15 MG PO TABS: 15.0000 mg | ORAL_TABLET | Freq: Every day | ORAL | 0 refills | Status: DC

## 2018-04-03 NOTE — Progress Notes (Signed)
Subjective:    Patient ID: Faith Jacobson, female    DOB: 03/20/1975, 43 y.o.   MRN: [id number]  HPI 43 year old female presents the office today for concerns of burning, sharp pain to her left fourth toe.  She states that she notices this more with certain shoes.  She has purchased over-the-counter Dr. Felicie Morn inserts which helped some.  When she puts pressure to help her foot at times she notices the burning to her toe.  She is also concerned about a bunion on her feet with the right side worse than left.  She states that multiple family members have bunions as well but is currently not causing any pain.  No recent treatment.  No other concerns   Review of Systems  All other systems reviewed and are negative.  Past Medical History:  Diagnosis Date  . Cervical dysplasia   . GERD (gastroesophageal reflux disease)   . HSV infection   . PONV (postoperative nausea and vomiting)   . Vaginal Pap smear, abnormal     Past Surgical History:  Procedure Laterality Date  . CESAREAN SECTION    . CESAREAN SECTION WITH BILATERAL TUBAL LIGATION Bilateral 06/30/2015   Procedure: CESAREAN SECTION WITH BILATERAL TUBAL LIGATION;  Surgeon: Everlene Farrier, MD;  Location: [facility];  Service: Obstetrics;  Laterality: Bilateral;  . COLPOSCOPY    . DILATION AND CURETTAGE OF UTERUS N/A 09/23/2015   Procedure: DILATATION AND CURETTAGE WITH ULTRASOUND GUIDANCE;  Surgeon: Marylynn Pearson, MD;  Location: [facility];  Service: Gynecology;  Laterality: N/A;  WITH ULTRASOUND GUIDANCE  . ESOPHAGEAL DILATION    . HYSTEROSCOPY N/A 09/23/2015   Procedure: HYSTEROSCOPY;  Surgeon: Marylynn Pearson, MD;  Location: [facility];  Service: Gynecology;  Laterality: N/A;  . TONSILLECTOMY AND ADENOIDECTOMY       Current Outpatient Medications:  .  omeprazole (PRILOSEC) 40 MG capsule, Take by mouth., Disp: , Rfl:  .  meloxicam (MOBIC) 15 MG tablet, Take 1 tablet (15 mg total) by mouth daily., Disp: 14 tablet, Rfl: 0 .   valACYclovir (VALTREX) 500 MG tablet, Take one tablet by mouth twice daily for 3-5 days then as needed., Disp: 90 tablet, Rfl: 3  No Known Allergies      Objective:   Physical Exam  General: AAO x3, NAD  Dermatological: Skin is warm, dry and supple bilateral. Nails x 10 are well manicured; remaining integument appears unremarkable at this time. There are no open sores, no preulcerative lesions, no rash or signs of infection present.  Vascular: Dorsalis Pedis artery and Posterior Tibial artery pedal pulses are 2/4 bilateral with immedate capillary fill time. There is no pain with calf compression, swelling, warmth, erythema.   Neruologic: Grossly intact via light touch bilateral. Vibratory intact via tuning fork bilateral. Protective threshold with Semmes Wienstein monofilament intact to all pedal sites bilateral. Negative tinel sign  Musculoskeletal: There is tenderness palpation on the third interspace of the left foot on palpation she does get burning pain to the fourth toe.  There is adductovarus present fourth and fifth toes as well.  Not able to palpate a neuroma however she is getting symptoms of nerve entrapment versus neuroma on the interspace.  There is no pain to the other interspaces and there is no area pinpoint tenderness.  Moderate bunion deformities present bilaterally there is mild erythema, rubs inside shoes.  There is no pain or crepitation with range of motion.  No pain to the bunion today.  No other areas of tenderness identified.  Muscular strength 5/5 in all groups tested bilateral.  Gait: Unassisted, Nonantalgic.     Assessment & Plan:  43 year old female with neuroma left third interspace, adductovarus of digits with bunion deformity -Treatment options discussed including all alternatives, risks, and complications -Etiology of symptoms were discussed -X-rays were obtained reviewed.  No evidence of acute fracture or stress fracture.  Bunion deformities  present. -Steroid injection to the third interspace performed today.  See procedure note below.  Dispensed neuroma pads.  We discussed different inserts for her shoes as well as shoe modifications.  Prescribed meloxicam as well. -Discussed the etiology progression bunion deformity.  We will continue with inserts for her shoes and discussed modifications regards to shoes.  In the future if symptoms progress consider surgical intervention.  Procedure: Injection Neuroma  Discussed alternatives, risks, complications and verbal consent was obtained.  Location: LEFT 3rd interspace  Skin Prep: Alcohol  Injectate: 0.5cc 0.5% marcaine plain, 0.5 cc 2% lidocaine plain and, 1 cc kenalog 10. Disposition: Patient tolerated procedure well. Injection site dressed with a band-aid.  Post-injection care was discussed and return precautions discussed.    Return in about 6 weeks (around 05/15/2018), or if symptoms worsen or fail to improve.  Trula Slade DPM

## 2018-04-03 NOTE — Patient Instructions (Signed)
Morton Neuralgia  Morton neuralgia is foot pain that affects the ball of the foot and the area near the toes. Morton neuralgia occurs when part of a nerve in the foot (digital nerve) is under too much pressure (compressed). When this happens over a long period of time, the nerve can thicken (neuroma) and cause pain. Pain usually occurs between the third and fourth toes.  Morton neuralgia can come and go but may get worse over time. What are the causes? This condition is caused by doing the same things over and over with your foot, such as:  Activities such as running or jumping.  Wearing shoes that are too tight. What increases the risk? You may be at higher risk for Morton neuralgia if you:  Are female.  Wear high heels.  Wear shoes that are narrow or tight.  Do activities that repeatedly stretch your toes, such as: ? Running. ? Ballet. ? Long-distance walking. What are the signs or symptoms? The first symptom of Morton neuralgia is pain that spreads from the ball of the foot to the toes. It may feel like you are walking on a marble. Pain usually gets worse with walking and goes away at night. Other symptoms may include numbness and cramping of your toes. Both feet are equally affected, but rarely at the same time. How is this diagnosed? This condition is diagnosed based on your symptoms, your medical history, and a physical exam. Your health care provider may:  Squeeze your foot just behind your toe.  Ask you to move your toes to check for pain.  Ask about your physical activity level. You also may have imaging tests, such as an X-ray, ultrasound, or MRI. How is this treated? Treatment depends on how severe your condition is and what causes it. Treatment may involve:  Wearing different shoes that are not too tight, are low-heeled, and provide good support. For some people, this is the only treatment needed.  Wearing an over-the-counter or custom supportive pad (orthotic)  under the front of your foot.  Getting injections of numbing medicine and anti-inflammatory medicine (steroid) in the nerve.  Having surgery to remove part of the thickened nerve. Follow these instructions at home: Managing pain, stiffness, and swelling   Massage your foot as needed.  Wear orthotics as told by your health care provider.  If directed, put ice on your foot: ? Put ice in a plastic bag. ? Place a towel between your skin and the bag. ? Leave the ice on for 20 minutes, 2-3 times a day.  Avoid activities that cause pain or make pain worse. If you play sports, ask your health care provider when it is safe for you to return to sports.  Raise (elevate) your foot above the level of your heart while lying down and, when possible, while sitting. General instructions  Take over-the-counter and prescription medicines only as told by your health care provider.  Do not drive or use heavy machinery while taking prescription pain medicine.  Wear shoes that: ? Have soft soles. ? Have a wide toe area. ? Provide arch support. ? Do not pinch or squeeze your feet. ? Have room for your orthotics, if applicable.  Keep all follow-up visits as told by your health care provider. This is important. Contact a health care provider if:  Your symptoms get worse or do not get better with treatment and home care. Summary  Morton neuralgia is foot pain that affects the ball of the foot and the   area near the toes. Pain usually occurs between the third and fourth toes, gets worse with walking, and goes away at night.  Morton neuralgia occurs when part of a nerve in the foot (digital nerve) is under too much pressure. When this happens over a long period of time, the nerve can thicken (neuroma) and cause pain.  This condition is caused by doing the same things over and over with your foot, such as running or jumping, wearing shoes that are too tight, or wearing high heels.  Treatment may  involve wearing low-heeled shoes that are not too tight, wearing a supportive pad (orthotic) under the front of your foot, getting injections in the nerve, or having surgery to remove part of the thickened nerve. This information is not intended to replace advice given to you by your health care provider. Make sure you discuss any questions you have with your health care provider. Document Released: 04/23/2000 Document Revised: 01/29/2017 Document Reviewed: 01/29/2017    Bunion  A bunion is a bump on the base of the big toe that forms when the bones of the big toe joint move out of position. Bunions may be small at first, but they often get larger over time. They can make walking painful. What are the causes? A bunion may be caused by:  Wearing narrow or pointed shoes that force the big toe to press against the other toes.  Abnormal foot development that causes the foot to roll inward (pronate).  Changes in the foot that are caused by certain diseases, such as rheumatoid arthritis or polio.  A foot injury. What increases the risk? The following factors may make you more likely to develop this condition:  Wearing shoes that squeeze the toes together.  Having certain diseases, such as: ? Rheumatoid arthritis. ? Polio. ? Cerebral palsy.  Having family members who have bunions.  Being born with a foot deformity, such as flat feet or low arches.  Doing activities that put a lot of pressure on the feet, such as ballet dancing. What are the signs or symptoms? The main symptom of a bunion is a noticeable bump on the big toe. Other symptoms may include:  Pain.  Swelling around the big toe.  Redness and inflammation.  Thick or hardened skin on the big toe or between the toes.  Stiffness or loss of motion in the big toe.  Trouble with walking. How is this diagnosed? A bunion may be diagnosed based on your symptoms, medical history, and activities. You may have tests, such  as:  X-rays. These allow your health care provider to check the position of the bones in your foot and look for damage to your joint. They also help your health care provider determine the severity of your bunion and the best way to treat it.  Joint aspiration. In this test, a sample of fluid is removed from the toe joint. This test may be done if you are in a lot of pain. It helps rule out diseases that cause painful swelling of the joints, such as arthritis. How is this treated? Treatment depends on the severity of your symptoms. The goal of treatment is to relieve symptoms and prevent the bunion from getting worse. Your health care provider may recommend:  Wearing shoes that have a wide toe box.  Using bunion pads to cushion the affected area.  Taping your toes together to keep them in a normal position.  Placing a device inside your shoe (orthotics) to help reduce  pressure on your toe joint.  Taking medicine to ease pain, inflammation, and swelling.  Applying heat or ice to the affected area.  Doing stretching exercises.  Surgery to remove scar tissue and move the toes back into their normal position. This treatment is rare. Follow these instructions at home: Managing pain, stiffness, and swelling   If directed, put ice on the painful area: ? Put ice in a plastic bag. ? Place a towel between your skin and the bag. ? Leave the ice on for 20 minutes, 2-3 times a day. Activity   If directed, apply heat to the affected area before you exercise. Use the heat source that your health care provider recommends, such as a moist heat pack or a heating pad. ? Place a towel between your skin and the heat source. ? Leave the heat on for 20-30 minutes. ? Remove the heat if your skin turns bright red. This is especially important if you are unable to feel pain, heat, or cold. You may have a greater risk of getting burned.  Do exercises as told by your health care provider. General  instructions  Support your toe joint with proper footwear, shoe padding, or taping as told by your health care provider.  Take over-the-counter and prescription medicines only as told by your health care provider.  Keep all follow-up visits as told by your health care provider. This is important. Contact a health care provider if your symptoms:  Get worse.  Do not improve in 2 weeks. Get help right away if you have:  Severe pain and trouble with walking. Summary  A bunion is a bump on the base of the big toe that forms when the bones of the big toe joint move out of position.  Bunions can make walking painful.  Treatment depends on the severity of your symptoms.  Support your toe joint with proper footwear, shoe padding, or taping as told by your health care provider. This information is not intended to replace advice given to you by your health care provider. Make sure you discuss any questions you have with your health care provider. Document Released: 01/15/2005 Document Revised: 05/28/2017 Document Reviewed: 05/28/2017 Elsevier Interactive Patient Education  2019 [employer].   Chartered certified accountant Patient Education  [employer].

## 2018-04-28 DIAGNOSIS — K219 Gastro-esophageal reflux disease without esophagitis: Secondary | ICD-10-CM | POA: Diagnosis not present

## 2018-04-28 DIAGNOSIS — R1310 Dysphagia, unspecified: Secondary | ICD-10-CM | POA: Diagnosis not present

## 2018-04-28 DIAGNOSIS — K222 Esophageal obstruction: Secondary | ICD-10-CM | POA: Diagnosis not present

## 2018-05-15 ENCOUNTER — Ambulatory Visit: Payer: BLUE CROSS/BLUE SHIELD | Admitting: Podiatry

## 2018-10-31 ENCOUNTER — Other Ambulatory Visit: Payer: Self-pay

## 2018-11-03 ENCOUNTER — Encounter: Payer: BC Managed Care – PPO | Admitting: Women's Health

## 2018-11-25 ENCOUNTER — Ambulatory Visit (INDEPENDENT_AMBULATORY_CARE_PROVIDER_SITE_OTHER): Payer: BC Managed Care – PPO | Admitting: Women's Health

## 2018-11-25 ENCOUNTER — Encounter: Payer: Self-pay | Admitting: Women's Health

## 2018-11-25 ENCOUNTER — Other Ambulatory Visit: Payer: Self-pay

## 2018-11-25 VITALS — BP 122/78 | Ht 63.0 in | Wt 154.0 lb

## 2018-11-25 DIAGNOSIS — Z113 Encounter for screening for infections with a predominantly sexual mode of transmission: Secondary | ICD-10-CM | POA: Diagnosis not present

## 2018-11-25 DIAGNOSIS — Z01419 Encounter for gynecological examination (general) (routine) without abnormal findings: Secondary | ICD-10-CM

## 2018-11-25 DIAGNOSIS — Z131 Encounter for screening for diabetes mellitus: Secondary | ICD-10-CM | POA: Diagnosis not present

## 2018-11-25 DIAGNOSIS — R87610 Atypical squamous cells of undetermined significance on cytologic smear of cervix (ASC-US): Secondary | ICD-10-CM | POA: Diagnosis not present

## 2018-11-25 DIAGNOSIS — Z833 Family history of diabetes mellitus: Secondary | ICD-10-CM | POA: Diagnosis not present

## 2018-11-25 MED ORDER — VALACYCLOVIR HCL 500 MG PO TABS: ORAL_TABLET | ORAL | 3 refills | Status: AC

## 2018-11-25 NOTE — Progress Notes (Signed)
Faith Jacobson Saturday 11-23-1975 [id number]    History:    Presents for annual exam.  Regular monthly cycle/BTL.  New partner.  2011 CIN-1 normal Paps after.  Smoker.  HSV no outbreaks.  Past medical history, past surgical history, family history and social history were all reviewed and documented in the EPIC chart.  Works at [employer].  ROS:  A ROS was performed and pertinent positives and negatives are included.  Exam:  Vitals:   11/25/18 1626  BP: 122/78  Weight: 154 lb (69.9 kg)  Height: 5\' 3"  (1.6 m)   Body mass index is 27.28 kg/m.   General appearance:  Normal Thyroid:  Symmetrical, normal in size, without palpable masses or nodularity. Respiratory  Auscultation:  Clear without wheezing or rhonchi Cardiovascular  Auscultation:  Regular rate, without rubs, murmurs or gallops  Edema/varicosities:  Not grossly evident Abdominal  Soft,nontender, without masses, guarding or rebound.  Liver/spleen:  No organomegaly noted  Hernia:  None appreciated  Skin  Inspection:  Grossly normal   Breasts: Examined lying and sitting.     Right: Without masses, retractions, discharge or axillary adenopathy.     Left: Without masses, retractions, discharge or axillary adenopathy. Gentitourinary   Inguinal/mons:  Normal without inguinal adenopathy  External genitalia:  Normal  BUS/Urethra/Skene's glands:  Normal  Vagina:  Normal  Cervix:  Normal  Uterus:  normal in size, shape and contour.  Midline and mobile  Adnexa/parametria:     Rt: Without masses or tenderness.   Lt: Without masses or tenderness.  Anus and perineum: Normal  Digital rectal exam: Normal sphincter tone without palpated masses or tenderness  Assessment/Plan:  43 y.o. S WF G1 P1 for annual exam with no complaints.  Regular monthly cycle/BTL STD screen Smoker 2011 CIN-1 normal Paps after HSV-no outbreaks  Plan:Marland Kitchen  Aware of hazards of smoking, tips for quitting discussed.  SBEs,   screening mammogram overdue encouraged to have mammogram annually.  Increase regular cardio type exercise, decrease calorie/carbs.  Valtrex 500 twice daily for 3 to 5 days as needed, prescription, proper use given.  CBC, glucose, HIV, RPR, GC/chlamydia, Pap with HR HPV typing.    [place], 4:30 PM 11/25/2018

## 2018-11-25 NOTE — Patient Instructions (Addendum)
It was nice seeing you today  Health Maintenance, Female Adopting a healthy lifestyle and getting preventive care are important in promoting health and wellness. Ask your health care provider about:  The right schedule for you to have regular tests and exams.  Things you can do on your own to prevent diseases and keep yourself healthy. What should I know about diet, weight, and exercise? Eat a healthy diet   Eat a diet that includes plenty of vegetables, fruits, low-fat dairy products, and lean protein.  Do not eat a lot of foods that are high in solid fats, added sugars, or sodium. Maintain a healthy weight Body mass index (BMI) is used to identify weight problems. It estimates body fat based on height and weight. Your health care provider can help determine your BMI and help you achieve or maintain a healthy weight. Get regular exercise Get regular exercise. This is one of the most important things you can do for your health. Most adults should:  Exercise for at least 150 minutes each week. The exercise should increase your heart rate and make you sweat (moderate-intensity exercise).  Do strengthening exercises at least twice a week. This is in addition to the moderate-intensity exercise.  Spend less time sitting. Even light physical activity can be beneficial. Watch cholesterol and blood lipids Have your blood tested for lipids and cholesterol at 43 years of age, then have this test every 5 years. Have your cholesterol levels checked more often if:  Your lipid or cholesterol levels are high.  You are older than 43 years of age.  You are at high risk for heart disease. What should I know about cancer screening? Depending on your health history and family history, you may need to have cancer screening at various ages. This may include screening for:  Breast cancer.  Cervical cancer.  Colorectal cancer.  Skin cancer.  Lung cancer. What should I know about heart disease,  diabetes, and high blood pressure? Blood pressure and heart disease  High blood pressure causes heart disease and increases the risk of stroke. This is more likely to develop in people who have high blood pressure readings, are of African descent, or are overweight.  Have your blood pressure checked: ? Every 3-5 years if you are 43-61 years of age. ? Every year if you are 43 years old or older. Diabetes Have regular diabetes screenings. This checks your fasting blood sugar level. Have the screening done:  Once every three years after age 40 if you are at a normal weight and have a low risk for diabetes.  More often and at a younger age if you are overweight or have a high risk for diabetes. What should I know about preventing infection? Hepatitis B If you have a higher risk for hepatitis B, you should be screened for this virus. Talk with your health care provider to find out if you are at risk for hepatitis B infection. Hepatitis C Testing is recommended for:  Everyone born from [birth year] through 1965.  Anyone with known risk factors for hepatitis C. Sexually transmitted infections (STIs)  Get screened for STIs, including gonorrhea and chlamydia, if: ? You are sexually active and are younger than 43 years of age. ? You are older than 43 years of age and your health care provider tells you that you are at risk for this type of infection. ? Your sexual activity has changed since you were last screened, and you are at increased risk for chlamydia or  gonorrhea. Ask your health care provider if you are at risk.  Ask your health care provider about whether you are at high risk for HIV. Your health care provider may recommend a prescription medicine to help prevent HIV infection. If you choose to take medicine to prevent HIV, you should first get tested for HIV. You should then be tested every 3 months for as long as you are taking the medicine. Pregnancy  If you are about to stop having your  period (premenopausal) and you may become pregnant, seek counseling before you get pregnant.  Take 400 to 800 micrograms (mcg) of folic acid every day if you become pregnant.  Ask for birth control (contraception) if you want to prevent pregnancy. Osteoporosis and menopause Osteoporosis is a disease in which the bones lose minerals and strength with aging. This can result in bone fractures. If you are 5 years old or older, or if you are at risk for osteoporosis and fractures, ask your health care provider if you should:  Be screened for bone loss.  Take a calcium or vitamin D supplement to lower your risk of fractures.  Be given hormone replacement therapy (HRT) to treat symptoms of menopause. Follow these instructions at home: Lifestyle  Do not use any products that contain nicotine or tobacco, such as cigarettes, e-cigarettes, and chewing tobacco. If you need help quitting, ask your health care provider.  Do not use street drugs.  Do not share needles.  Ask your health care provider for help if you need support or information about quitting drugs. Alcohol use  Do not drink alcohol if: ? Your health care provider tells you not to drink. ? You are pregnant, may be pregnant, or are planning to become pregnant.  If you drink alcohol: ? Limit how much you use to 0-1 drink a day. ? Limit intake if you are breastfeeding.  Be aware of how much alcohol is in your drink. In the [place] one drink equals one 12 oz bottle of beer (355 mL), one 5 oz glass of wine (148 mL), or one 1 oz glass of hard liquor (44 mL). General instructions  Schedule regular health, dental, and eye exams.  Stay current with your vaccines.  Tell your health care provider if: ? You often feel depressed. ? You have ever been abused or do not feel safe at home. Summary  Adopting a healthy lifestyle and getting preventive care are important in promoting health and wellness.  Follow your health care provider's  instructions about healthy diet, exercising, and getting tested or screened for diseases.  Follow your health care provider's instructions on monitoring your cholesterol and blood pressure. This information is not intended to replace advice given to you by your health care provider. Make sure you discuss any questions you have with your health care provider. Document Released: 07/31/2010 Document Revised: 01/08/2018 Document Reviewed: 01/08/2018 Elsevier Patient Education  2020 [employer].

## 2018-11-26 LAB — PAP IG, CT-NG NAA, HPV HIGH-RISK
C. trachomatis RNA, TMA: NOT DETECTED
HPV DNA High Risk: DETECTED — AB
N. gonorrhoeae RNA, TMA: NOT DETECTED

## 2018-11-27 LAB — CBC WITH DIFFERENTIAL/PLATELET
Absolute Monocytes: 745 cells/uL (ref 200–950)
Basophils Absolute: 71 cells/uL (ref 0–200)
Basophils Relative: 0.7 %
Eosinophils Absolute: 469 cells/uL (ref 15–500)
Eosinophils Relative: 4.6 %
HCT: 48.4 % — ABNORMAL HIGH (ref 35.0–45.0)
Hemoglobin: 16 g/dL — ABNORMAL HIGH (ref 11.7–15.5)
Lymphs Abs: 3182 cells/uL (ref 850–3900)
MCH: 31.6 pg (ref 27.0–33.0)
MCHC: 33.1 g/dL (ref 32.0–36.0)
MCV: 95.5 fL (ref 80.0–100.0)
MPV: 11.8 fL (ref 7.5–12.5)
Monocytes Relative: 7.3 %
Neutro Abs: 5732 cells/uL (ref 1500–7800)
Neutrophils Relative %: 56.2 %
Platelets: 270 10*3/uL (ref 140–400)
RBC: 5.07 10*6/uL (ref 3.80–5.10)
RDW: 12.8 % (ref 11.0–15.0)
Total Lymphocyte: 31.2 %
WBC: 10.2 10*3/uL (ref 3.8–10.8)

## 2018-11-27 LAB — RPR: RPR Ser Ql: NONREACTIVE

## 2018-11-27 LAB — HIV ANTIBODY (ROUTINE TESTING W REFLEX): HIV 1&2 Ab, 4th Generation: NONREACTIVE

## 2018-11-27 LAB — GLUCOSE, RANDOM: Glucose, Bld: 77 mg/dL (ref 65–99)

## 2018-12-30 ENCOUNTER — Telehealth: Payer: Self-pay

## 2018-12-30 NOTE — Telephone Encounter (Signed)
I called patient to follow up because we spoke with her end of Oct and recommended Colposcopy for atypia and Pos HR HPV.  She never scheduled.  Patient said she knows her body really well and the day she had the Pap smear she was getting ready to start her menses and had HSV outbreak. She thinks that affected result. She said she wanted to discuss with you if okay for her to wait 3-6 mos and just repeat the Pap smear. I explained that Pap smear only sees a few random cells but colpo will let Dr. Loetta Rough see all the cells on her cervix and confirm result.    She really wants to wait and not do colpo now.

## 2018-12-30 NOTE — Telephone Encounter (Signed)
Telephone call, 2018 Pap normal with negative high-risk HPV.  2011 LGSIL with LEEP.  Reviewed importance of follow-up with C&B due to the positive high risk HPV verbalized understanding switched to appointments.

## 2019-02-09 ENCOUNTER — Other Ambulatory Visit: Payer: Self-pay

## 2019-02-10 ENCOUNTER — Ambulatory Visit: Payer: BC Managed Care – PPO | Admitting: Obstetrics & Gynecology

## 2019-02-27 ENCOUNTER — Encounter: Payer: Self-pay | Admitting: Obstetrics & Gynecology

## 2019-02-27 ENCOUNTER — Other Ambulatory Visit: Payer: Self-pay | Admitting: Obstetrics & Gynecology

## 2019-02-27 ENCOUNTER — Ambulatory Visit: Payer: 59 | Admitting: Obstetrics & Gynecology

## 2019-02-27 ENCOUNTER — Other Ambulatory Visit: Payer: Self-pay

## 2019-02-27 VITALS — BP 118/80

## 2019-02-27 DIAGNOSIS — R87610 Atypical squamous cells of undetermined significance on cytologic smear of cervix (ASC-US): Secondary | ICD-10-CM | POA: Diagnosis not present

## 2019-02-27 DIAGNOSIS — R87810 Cervical high risk human papillomavirus (HPV) DNA test positive: Secondary | ICD-10-CM | POA: Diagnosis not present

## 2019-02-27 NOTE — Patient Instructions (Signed)
1. ASCUS with positive high risk HPV cervical ASCUS and High Risk HPV counseling done.  Colposcopy procedure reviewed.  Colposcopy findings reviewed.  Management per results.  Post procedure precautions reviewed. - Surgical pathology( [facility]/ [facility]) - HPV Type 16 and 18/45 RNA  Faith Jacobson, it was a pleasure meeting you today!  I will inform you of your results as soon as they are available.

## 2019-02-27 NOTE — Progress Notes (Signed)
    Faith Jacobson [birth year] [id number]        44 y.o.  G1P1001 Recently engaged, boyfriend x 1 year  RP: ASCUS/HPV HR positive 11/25/2018  HPI: ASCUS/HPV HR positive 10/2018.  Gono-Chlam Negative 10/2018.  Remote h/o CIN 1, then Paps Negative until last one.     OB History  Gravida Para Term Preterm AB Living  1 1 1    0 1  SAB TAB Ectopic Multiple Live Births      0 0 1    # Outcome Date GA Lbr Len/2nd Weight Sex Delivery Anes PTL Lv  1 Term 06/30/15 [redacted]w[redacted]d  7 lb 2.3 oz (3.24 kg) M CS-LTranv Spinal  LIV    Past medical history,surgical history, problem list, medications, allergies, family history and social history were all reviewed and documented in the EPIC chart.   Directed ROS with pertinent positives and negatives documented in the history of present illness/assessment and plan.  Exam:  Vitals:   02/27/19 1205  BP: 118/80   General appearance:  Normal  Colposcopy Procedure Note Faith Jacobson 02/27/2019  Indications:  ASCUS/HPV HR positive  Procedure Details  The risks and benefits of the procedure and Verbal informed consent obtained.  Speculum placed in vagina and excellent visualization of cervix achieved, cervix swabbed x 3 with acetic acid solution.  Findings:  Cervix colposcopy: Physical Exam Genitourinary:      Vaginal colposcopy: Normal  Vulvar colposcopy: Normal  Perirectal colposcopy: Normal  The cervix was sprayed with Hurricane before performing the cervical biopsies.  Specimens: HPV 16-18-45.  Cervical Bx at 2-3 O'Clock.  Complications:  None, good hemostasis with Silver Ni . Plan: Management per results.   Assessment/Plan:  44 y.o. G1P1001   1. ASCUS with positive high risk HPV cervical ASCUS and High Risk HPV counseling done.  Colposcopy procedure reviewed.  Colposcopy findings reviewed.  Management per results.  Post procedure precautions reviewed. - Surgical pathology( [place]/ [facility]) - HPV Type 16 and  18/45 RNA  Faith Bruins MD, 12:29 PM 02/27/2019

## 2019-03-03 LAB — HPV TYPE 16 AND 18/45 RNA
HPV Type 16 RNA: NOT DETECTED
HPV Type 18/45 RNA: NOT DETECTED

## 2019-09-14 ENCOUNTER — Encounter: Payer: Self-pay | Admitting: Obstetrics & Gynecology

## 2019-09-14 ENCOUNTER — Ambulatory Visit: Payer: No Typology Code available for payment source | Admitting: Obstetrics & Gynecology

## 2019-09-14 ENCOUNTER — Other Ambulatory Visit: Payer: Self-pay

## 2019-09-14 VITALS — BP 110/70

## 2019-09-14 DIAGNOSIS — N870 Mild cervical dysplasia: Secondary | ICD-10-CM | POA: Diagnosis not present

## 2019-09-14 DIAGNOSIS — R87810 Cervical high risk human papillomavirus (HPV) DNA test positive: Secondary | ICD-10-CM | POA: Diagnosis not present

## 2019-09-14 NOTE — Addendum Note (Signed)
Addended by: Thurnell Garbe A on: 09/14/2019 02:51 PM   Modules accepted: Orders

## 2019-09-14 NOTE — Progress Notes (Signed)
    Faith Jacobson [birth year] [id number]        43 y.o.  G1P1001   RP: Repeat 6 month Pap test  HPI:  Pap ASCUS/HPV HR pos 10/2018.  Colposcopy 01/2019 CIN 1.  HPV 16-18-45 Negative.     OB History  Gravida Para Term Preterm AB Living  1 1 1    0 1  SAB TAB Ectopic Multiple Live Births      0 0 1    # Outcome Date GA Lbr Len/2nd Weight Sex Delivery Anes PTL Lv  1 Term 06/30/15 [redacted]w[redacted]d  7 lb 2.3 oz (3.24 kg) M CS-LTranv Spinal  LIV    Past medical history,surgical history, problem list, medications, allergies, family history and social history were all reviewed and documented in the EPIC chart.   Directed ROS with pertinent positives and negatives documented in the history of present illness/assessment and plan.  Exam:  Vitals:   09/14/19 1403  BP: 110/70   General appearance:  Normal  Abdomen: Normal  Gynecologic exam: Vulva normal.  Speculum:  Cervix/Vagina normal.  Pap reflex done.  Secretions normal.   Assessment/Plan:  44 y.o. G1P1001   1. Dysplasia of cervix, low grade (CIN 1) ASCUS with high risk HPV + October 2020.  Colposcopy January 2021 showed CIN-1.  Repeat Pap test done today.  We will follow-up for annual gynecologic exam in 4 months.  Will schedule her screening mammogram now.  2. Cervical high risk HPV (human papillomavirus) test positive HPV 16-18-45 Negative.  Princess Bruins MD, 2:10 PM 09/14/2019

## 2019-09-17 LAB — PAP IG W/ RFLX HPV ASCU

## 2019-09-17 LAB — HUMAN PAPILLOMAVIRUS, HIGH RISK: HPV DNA High Risk: DETECTED — AB

## 2019-09-22 NOTE — Telephone Encounter (Signed)
Called her and per DPR access note on file I left detailed message in her voice mail with result/recommendation. Recall on file.

## 2019-12-31 ENCOUNTER — Encounter: Payer: No Typology Code available for payment source | Admitting: Obstetrics & Gynecology

## 2020-02-12 ENCOUNTER — Encounter: Payer: No Typology Code available for payment source | Admitting: Obstetrics & Gynecology

## 2020-03-08 ENCOUNTER — Ambulatory Visit: Payer: No Typology Code available for payment source | Admitting: Obstetrics & Gynecology

## 2020-04-26 ENCOUNTER — Ambulatory Visit: Payer: No Typology Code available for payment source | Admitting: Obstetrics & Gynecology

## 2020-04-27 ENCOUNTER — Ambulatory Visit: Payer: No Typology Code available for payment source | Admitting: Obstetrics & Gynecology

## 2020-07-15 ENCOUNTER — Encounter: Payer: Self-pay | Admitting: Obstetrics & Gynecology

## 2020-07-15 ENCOUNTER — Ambulatory Visit (INDEPENDENT_AMBULATORY_CARE_PROVIDER_SITE_OTHER): Payer: No Typology Code available for payment source | Admitting: Obstetrics & Gynecology

## 2020-07-15 ENCOUNTER — Other Ambulatory Visit (HOSPITAL_COMMUNITY)
Admission: RE | Admit: 2020-07-15 | Discharge: 2020-07-15 | Disposition: A | Discharge status: Home / Self Care | Payer: No Typology Code available for payment source | Source: Ambulatory Visit | Attending: Obstetrics & Gynecology | Admitting: Obstetrics & Gynecology

## 2020-07-15 ENCOUNTER — Other Ambulatory Visit: Payer: Self-pay

## 2020-07-15 VITALS — BP 120/70 | Ht 62.5 in | Wt 150.0 lb

## 2020-07-15 DIAGNOSIS — Z01419 Encounter for gynecological examination (general) (routine) without abnormal findings: Secondary | ICD-10-CM | POA: Diagnosis not present

## 2020-07-15 DIAGNOSIS — Z9851 Tubal ligation status: Secondary | ICD-10-CM | POA: Diagnosis not present

## 2020-07-15 DIAGNOSIS — N870 Mild cervical dysplasia: Secondary | ICD-10-CM | POA: Diagnosis not present

## 2020-07-15 NOTE — Progress Notes (Signed)
Faith Jacobson 03-Apr-1975 [id number]   History:    45 y.o. G1P1001 Engaged.  S/P Tubal Ligation   RP: ASCUS/HPV HR positive 11/25/2018   HPI: Menses regular normal every month.  No BTB.  No pelvic pain.  No pain with IC.  Colposcopy 01/2019 CIN 1. HPV 16-18-45 Negative.  Pap 08/2019 ASCUS/HPV HR pos.  Breasts normal.  Last mammo 2019.  BMI 27.  Health labs normal this year with Fam MD.     Past medical history,surgical history, family history and social history were all reviewed and documented in the EPIC chart.  Gynecologic History Patient's last menstrual period was 07/04/2020.  Obstetric History OB History  Gravida Para Term Preterm AB Living  1 1 1    0 1  SAB IAB Ectopic Multiple Live Births      0 0 1    # Outcome Date GA Lbr Len/2nd Weight Sex Delivery Anes PTL Lv  1 Term 06/30/15 [redacted]w[redacted]d  7 lb 2.3 oz (3.24 kg) M CS-LTranv Spinal  LIV     ROS: A ROS was performed and pertinent positives and negatives are included in the history.  GENERAL: No fevers or chills. HEENT: No change in vision, no earache, sore throat or sinus congestion. NECK: No pain or stiffness. CARDIOVASCULAR: No chest pain or pressure. No palpitations. PULMONARY: No shortness of breath, cough or wheeze. GASTROINTESTINAL: No abdominal pain, nausea, vomiting or diarrhea, melena or bright red blood per rectum. GENITOURINARY: No urinary frequency, urgency, hesitancy or dysuria. MUSCULOSKELETAL: No joint or muscle pain, no back pain, no recent trauma. DERMATOLOGIC: No rash, no itching, no lesions. ENDOCRINE: No polyuria, polydipsia, no heat or cold intolerance. No recent change in weight. HEMATOLOGICAL: No anemia or easy bruising or bleeding. NEUROLOGIC: No headache, seizures, numbness, tingling or weakness. PSYCHIATRIC: No depression, no loss of interest in normal activity or change in sleep pattern.     Exam:   BP 120/70   Ht 5' 2.5" (1.588 m)   Wt 150 lb (68 kg)   LMP 07/04/2020   BMI 27.00 kg/m   Body  mass index is 27 kg/m.  General appearance : Well developed well nourished female. No acute distress HEENT: Eyes: no retinal hemorrhage or exudates,  Neck supple, trachea midline, no carotid bruits, no thyroidmegaly Lungs: Clear to auscultation, no rhonchi or wheezes, or rib retractions  Heart: Regular rate and rhythm, no murmurs or gallops Breast:Examined in sitting and supine position were symmetrical in appearance, no palpable masses or tenderness,  no skin retraction, no nipple inversion, no nipple discharge, no skin discoloration, no axillary or supraclavicular lymphadenopathy Abdomen: no palpable masses or tenderness, no rebound or guarding Extremities: no edema or skin discoloration or tenderness  Pelvic: Vulva: Normal             Vagina: No gross lesions or discharge  Cervix: No gross lesions or discharge.  Pap reflex done.  Uterus  AV, normal size, shape and consistency, non-tender and mobile  Adnexa  Without masses or tenderness  Anus: Normal   Assessment/Plan:  45 y.o. female for annual exam   1. Encounter for routine gynecological examination with Papanicolaou smear of cervix Normal gynecologic exam.  Pap reflex done.  Breast exam normal.  Will schedule a screening mammo.  BMI 27.  Health labs normal with Fam MD this year. - Cytology - PAP( [place])  2. Dysplasia of cervix, low grade (CIN 1) Pap done  3. S/P tubal ligation   Princess Bruins MD, 3:48  PM 07/15/2020

## 2020-07-18 LAB — CYTOLOGY - PAP: Diagnosis: NEGATIVE
